# Patient Record
Sex: Female | Born: 1950 | Race: White | Hispanic: No | Marital: Married | Smoking: Never smoker
Health system: Southern US, Community
[De-identification: ages and names within clinical notes are randomized; demographics above are authoritative.]

## PROBLEM LIST (undated history)

## (undated) DIAGNOSIS — E0590 Thyrotoxicosis, unspecified without thyrotoxic crisis or storm: Secondary | ICD-10-CM

## (undated) DIAGNOSIS — M069 Rheumatoid arthritis, unspecified: Secondary | ICD-10-CM

## (undated) DIAGNOSIS — M1990 Unspecified osteoarthritis, unspecified site: Secondary | ICD-10-CM

## (undated) DIAGNOSIS — J45909 Unspecified asthma, uncomplicated: Secondary | ICD-10-CM

## (undated) DIAGNOSIS — I350 Nonrheumatic aortic (valve) stenosis: Secondary | ICD-10-CM

## (undated) DIAGNOSIS — E042 Nontoxic multinodular goiter: Secondary | ICD-10-CM

## (undated) DIAGNOSIS — I429 Cardiomyopathy, unspecified: Secondary | ICD-10-CM

## (undated) DIAGNOSIS — N83209 Unspecified ovarian cyst, unspecified side: Secondary | ICD-10-CM

## (undated) DIAGNOSIS — I499 Cardiac arrhythmia, unspecified: Secondary | ICD-10-CM

## (undated) DIAGNOSIS — I10 Essential (primary) hypertension: Secondary | ICD-10-CM

## (undated) HISTORY — PX: EXCISION MORTON'S NEUROMA: SHX5013

## (undated) HISTORY — DX: Unspecified ovarian cyst, unspecified side: N83.209

## (undated) HISTORY — DX: Rheumatoid arthritis, unspecified: M06.9

## (undated) HISTORY — PX: CARDIAC CATHETERIZATION: SHX172

## (undated) HISTORY — DX: Essential (primary) hypertension: I10

## (undated) HISTORY — PX: JOINT REPLACEMENT: SHX530

## (undated) HISTORY — DX: Nontoxic multinodular goiter: E04.2

---

## 2005-06-04 ENCOUNTER — Emergency Department: Payer: Self-pay | Admitting: Unknown Physician Specialty

## 2006-04-14 ENCOUNTER — Ambulatory Visit: Payer: Self-pay | Admitting: Family Medicine

## 2006-04-22 ENCOUNTER — Ambulatory Visit: Payer: Self-pay | Admitting: Family Medicine

## 2006-06-25 ENCOUNTER — Ambulatory Visit: Payer: Self-pay | Admitting: Podiatry

## 2007-05-19 ENCOUNTER — Ambulatory Visit: Payer: Self-pay | Admitting: Family Medicine

## 2007-06-28 DIAGNOSIS — N83209 Unspecified ovarian cyst, unspecified side: Secondary | ICD-10-CM

## 2007-06-28 HISTORY — PX: COMBINED HYSTEROSCOPY DIAGNOSTIC / D&C: SUR297

## 2007-06-28 HISTORY — PX: ESOPHAGOGASTRODUODENOSCOPY: SHX1529

## 2007-06-28 HISTORY — DX: Unspecified ovarian cyst, unspecified side: N83.209

## 2007-07-04 ENCOUNTER — Ambulatory Visit: Payer: Self-pay | Admitting: Unknown Physician Specialty

## 2007-07-05 ENCOUNTER — Ambulatory Visit: Payer: Self-pay | Admitting: Gastroenterology

## 2007-07-07 ENCOUNTER — Ambulatory Visit: Payer: Self-pay | Admitting: Unknown Physician Specialty

## 2007-08-16 ENCOUNTER — Ambulatory Visit: Payer: Self-pay | Admitting: Otolaryngology

## 2007-08-18 ENCOUNTER — Ambulatory Visit: Payer: Self-pay | Admitting: Otolaryngology

## 2007-11-23 ENCOUNTER — Ambulatory Visit: Payer: Self-pay | Admitting: Unknown Physician Specialty

## 2008-06-04 ENCOUNTER — Ambulatory Visit: Payer: Self-pay | Admitting: Internal Medicine

## 2008-08-27 ENCOUNTER — Ambulatory Visit: Payer: Self-pay | Admitting: Internal Medicine

## 2009-06-06 ENCOUNTER — Ambulatory Visit: Payer: Self-pay | Admitting: Internal Medicine

## 2010-06-09 ENCOUNTER — Ambulatory Visit: Payer: Self-pay | Admitting: Internal Medicine

## 2010-08-28 ENCOUNTER — Ambulatory Visit: Payer: Self-pay | Admitting: Internal Medicine

## 2011-03-18 ENCOUNTER — Ambulatory Visit: Payer: Self-pay | Admitting: Internal Medicine

## 2011-07-23 ENCOUNTER — Ambulatory Visit: Payer: Self-pay | Admitting: Internal Medicine

## 2011-09-07 ENCOUNTER — Ambulatory Visit: Payer: Self-pay | Admitting: Surgery

## 2011-09-09 LAB — PATHOLOGY REPORT

## 2011-09-17 ENCOUNTER — Telehealth: Payer: Self-pay | Admitting: Internal Medicine

## 2011-09-17 NOTE — Telephone Encounter (Signed)
Pt called to make sure you have labs that she had done July and sterotack[facility] biospy on her breast this was done [place] 09/07/11 pt has appointment with dr Darrick Huntsman on 10/1

## 2011-09-28 ENCOUNTER — Encounter: Payer: Self-pay | Admitting: Internal Medicine

## 2011-09-28 ENCOUNTER — Ambulatory Visit (INDEPENDENT_AMBULATORY_CARE_PROVIDER_SITE_OTHER): Payer: Self-pay | Admitting: Internal Medicine

## 2011-09-28 VITALS — BP 147/86 | HR 78 | Temp 97.9°F | Resp 16 | Ht 61.5 in | Wt 166.2 lb

## 2011-09-28 DIAGNOSIS — E663 Overweight: Secondary | ICD-10-CM

## 2011-09-28 DIAGNOSIS — M069 Rheumatoid arthritis, unspecified: Secondary | ICD-10-CM | POA: Insufficient documentation

## 2011-09-28 DIAGNOSIS — E669 Obesity, unspecified: Secondary | ICD-10-CM

## 2011-09-28 DIAGNOSIS — Z6825 Body mass index (BMI) 25.0-25.9, adult: Secondary | ICD-10-CM

## 2011-09-28 DIAGNOSIS — M810 Age-related osteoporosis without current pathological fracture: Secondary | ICD-10-CM

## 2011-09-28 MED ORDER — IBANDRONATE SODIUM 150 MG PO TABS: 150.0000 mg | ORAL_TABLET | ORAL | Status: DC

## 2011-09-28 MED ORDER — PHENTERMINE HCL 37.5 MG PO TABS: 18.0000 mg | ORAL_TABLET | Freq: Every day | ORAL | Status: DC

## 2011-09-28 NOTE — Progress Notes (Signed)
  Subjective:    Patient ID: Brooke Tucker, female    DOB: 1951-12-12, 60 y.o.   MRN: [id number]  HPI 60 yo white female with history of rheumatoid arthritis, osteoporosis, and obesity presents for 6 month followup.  She has intentionally 20-25 lbs over the last 6 months through diet and exercise but weight has plateaued in the last month and she is frustrated.  She has foot surgery planned for early December on her right 5th metatarsal and will be unable to exercise for 4-6 weeks and is concerned about the potential weight gain.  Her goal is to be 150 lbs.    Also she underwent a left breast stereotactic biopsy done bc of microcalcifications .  Occurred on sept 10th after waiting 1 month for appt after abnormal mammogram., .  The biopsy was normal .      Review of Systems  Constitutional: Negative for fever, chills and unexpected weight change.  HENT: Negative for hearing loss, ear pain, nosebleeds, congestion, sore throat, facial swelling, rhinorrhea, sneezing, mouth sores, trouble swallowing, neck pain, neck stiffness, voice change, postnasal drip, sinus pressure, tinnitus and ear discharge.   Eyes: Negative for pain, discharge, redness and visual disturbance.  Respiratory: Negative for cough, chest tightness, shortness of breath, wheezing and stridor.   Cardiovascular: Negative for chest pain, palpitations and leg swelling.  Musculoskeletal: Negative for myalgias and arthralgias.  Skin: Negative for color change and rash.  Neurological: Negative for dizziness, weakness, light-headedness and headaches.  Hematological: Negative for adenopathy.       Objective:   Physical Exam  Constitutional: She is oriented to person, place, and time. She appears well-developed and well-nourished.  HENT:  Mouth/Throat: Oropharynx is clear and moist.  Eyes: EOM are normal. Pupils are equal, round, and reactive to light. No scleral icterus.  Neck: Normal range of motion. Neck supple. No JVD present. No  thyromegaly present.  Cardiovascular: Normal rate, regular rhythm, normal heart sounds and intact distal pulses.   Pulmonary/Chest: Effort normal and breath sounds normal.  Abdominal: Soft. Bowel sounds are normal. She exhibits no mass. There is no tenderness.  Musculoskeletal: Normal range of motion. She exhibits no edema.  Lymphadenopathy:    She has no cervical adenopathy.  Neurological: She is alert and oriented to person, place, and time.  Skin: Skin is warm and dry.  Psychiatric: She has a normal mood and affect.          Assessment & Plan:

## 2011-09-28 NOTE — Patient Instructions (Signed)
Take 1000 units of Vit D daily.Brooke Tucker check your level in Feb/March.  We are going to try phentermine (Adipex)  1/2 tablet once or twice daily 1 hour before your largest meals to help you lose the weight .   Consider trying the Atkins snack size (140 cal or less) protein bar,  Compare it to the Fiber One bar for carbohydrates and fiber.   Try  Joseph's brand pita bread and flat bread (red wrapper)  50 cal and 4 carbs/serving.   Try Breyer's carb smart fudgsicle:  It has 100 cal and 3 net carbs.

## 2011-09-28 NOTE — Assessment & Plan Note (Signed)
With partial success but recent plateau despite daily efforts.  Trial of phentermine after 10 minute discussion of risks and benefits.

## 2011-10-26 ENCOUNTER — Other Ambulatory Visit (INDEPENDENT_AMBULATORY_CARE_PROVIDER_SITE_OTHER): Payer: Managed Care, Other (non HMO) | Admitting: *Deleted

## 2011-10-26 DIAGNOSIS — E785 Hyperlipidemia, unspecified: Secondary | ICD-10-CM

## 2011-10-26 LAB — LIPID PANEL
Cholesterol: 166 mg/dL (ref 0–200)
HDL: 72.2 mg/dL (ref >39.00)
Total CHOL/HDL Ratio: 2
Triglycerides: 83 mg/dL (ref 0.0–149.0)

## 2011-11-06 ENCOUNTER — Encounter: Payer: Self-pay | Admitting: Internal Medicine

## 2011-11-09 ENCOUNTER — Ambulatory Visit (INDEPENDENT_AMBULATORY_CARE_PROVIDER_SITE_OTHER): Payer: Managed Care, Other (non HMO) | Admitting: Internal Medicine

## 2011-11-09 ENCOUNTER — Encounter: Payer: Self-pay | Admitting: Internal Medicine

## 2011-11-09 VITALS — BP 124/70 | HR 72 | Temp 98.5°F | Resp 14 | Ht 62.0 in | Wt 157.5 lb

## 2011-11-09 DIAGNOSIS — Z01818 Encounter for other preprocedural examination: Secondary | ICD-10-CM

## 2011-11-09 DIAGNOSIS — Z6825 Body mass index (BMI) 25.0-25.9, adult: Secondary | ICD-10-CM

## 2011-11-09 DIAGNOSIS — E663 Overweight: Secondary | ICD-10-CM

## 2011-11-09 NOTE — Patient Instructions (Signed)
You are doing great on the weight loss program.  We will stop the phentermimne when you reach 150 lbs or if your weight plateaus for a month, whichever comes first.  Your cholesterol is excellent!  Return in 6 months.

## 2011-11-09 NOTE — Progress Notes (Signed)
Subjective:    Patient ID: Brooke Tucker, female    DOB: 08/28/1951, 60 y.o.   MRN: [id number]  HPI  [name] is a 60 yo white female with a history of RA and osteoporosis referred by Dr. Alberteen Tucker for a preoperative evaluation for planned right foot surgery by Dr. Alberteen Tucker on 12/7.  She feels great and has continued to lose weight intentionally using phentermine which is suppressing her appetite.  Her initial weight was 190 lbs in Feb 2012 . She denies recent  URI symptoms, chest pain or shortness of breath.  She has no history of diabetes, hypertension, CAD or renal insufficiency.  Past Medical History  Diagnosis Date  . Hypertension   . Rheumatoid arthritis   . Ovarian cyst July 2008    laparscopic biopsy normal    Current Outpatient Prescriptions on File Prior to Visit  Medication Sig Dispense Refill  . Calcium Carbonate-Vitamin D (CALCIUM + D) 600-200 MG-UNIT TABS Take by mouth.        . ergocalciferol (VITAMIN D2) 50000 UNITS capsule Take 50,000 Units by mouth once a week.        . ibandronate (BONIVA) 150 MG tablet Take 1 tablet (150 mg total) by mouth every 30 (thirty) days. Take in the morning with a full glass of water, on an empty stomach, and do not take anything else by mouth or lie down for the next 30 min.  3 tablet  3  . methotrexate (RHEUMATREX) 2.5 MG tablet Take 2.5 mg by mouth once a week. Take three tablets on Thursday.       . Multiple Vitamin (MULTIVITAMIN) tablet Take 1 tablet by mouth daily.          Review of Systems  Constitutional: Negative for fever, chills and unexpected weight change.  HENT: Negative for hearing loss, ear pain, nosebleeds, congestion, sore throat, facial swelling, rhinorrhea, sneezing, mouth sores, trouble swallowing, neck pain, neck stiffness, voice change, postnasal drip, sinus pressure, tinnitus and ear discharge.   Eyes: Negative for pain, discharge, redness and visual disturbance.  Respiratory: Negative for cough, chest tightness, shortness  of breath, wheezing and stridor.   Cardiovascular: Negative for chest pain, palpitations and leg swelling.  Musculoskeletal: Negative for myalgias and arthralgias.  Skin: Negative for color change and rash.  Neurological: Negative for dizziness, weakness, light-headedness and headaches.  Hematological: Negative for adenopathy.       Objective:   Physical Exam  Constitutional: She is oriented to person, place, and time. She appears well-developed and well-nourished.  HENT:  Mouth/Throat: Oropharynx is clear and moist.  Eyes: EOM are normal. Pupils are equal, round, and reactive to light. No scleral icterus.  Neck: Normal range of motion. Neck supple. No JVD present. No thyromegaly present.  Cardiovascular: Normal rate, regular rhythm, normal heart sounds and intact distal pulses.   Pulmonary/Chest: Effort normal and breath sounds normal.  Abdominal: Soft. Bowel sounds are normal. She exhibits no mass. There is no tenderness.  Musculoskeletal: Normal range of motion. She exhibits edema.  Lymphadenopathy:    She has no cervical adenopathy.  Neurological: She is alert and oriented to person, place, and time.  Skin: Skin is warm and dry.  Psychiatric: She has a normal mood and affect.          Assessment & Plan:  Preoperative evaluation; exam and screening EKG done in office today was normal.  She is considered low risk for perioperative morbidity/mortality.  Overweight:  She is now considered overweight ,  no longer obese,with loss of over 30 lbs  which she has shed with appetite suppression and exercise.  Continue phentermine until  she reaches 150 lbs or her weight plateaus.

## 2011-11-16 ENCOUNTER — Encounter: Payer: Self-pay | Admitting: Internal Medicine

## 2011-11-28 HISTORY — PX: METATARSAL OSTEOTOMY: SHX1641

## 2011-12-04 ENCOUNTER — Ambulatory Visit: Payer: Self-pay | Admitting: Podiatry

## 2011-12-10 LAB — PATHOLOGY REPORT

## 2011-12-25 ENCOUNTER — Encounter: Payer: Self-pay | Admitting: Internal Medicine

## 2012-02-03 ENCOUNTER — Encounter: Payer: Self-pay | Admitting: Internal Medicine

## 2012-03-07 ENCOUNTER — Ambulatory Visit: Payer: Self-pay | Admitting: Surgery

## 2012-03-22 LAB — HM MAMMOGRAPHY

## 2012-04-14 ENCOUNTER — Other Ambulatory Visit: Payer: Self-pay | Admitting: Internal Medicine

## 2012-04-15 ENCOUNTER — Telehealth: Payer: Self-pay | Admitting: Internal Medicine

## 2012-04-15 NOTE — Telephone Encounter (Signed)
Patient states the pharmacy sent over refill request yesterday for her phentermine 37.5 mg, she uses [facility].

## 2012-04-15 NOTE — Telephone Encounter (Signed)
This request was put in yesterday and is waiting MD approval.

## 2012-05-30 ENCOUNTER — Other Ambulatory Visit: Payer: Self-pay | Admitting: Internal Medicine

## 2012-05-30 MED ORDER — IBANDRONATE SODIUM 150 MG PO TABS: 150.0000 mg | ORAL_TABLET | ORAL | Status: DC

## 2012-05-30 NOTE — Telephone Encounter (Signed)
Patient wants the Rx faxed to her  Fax number: [phone]

## 2012-05-30 NOTE — Telephone Encounter (Signed)
printed

## 2012-10-13 ENCOUNTER — Other Ambulatory Visit: Payer: Self-pay

## 2012-10-13 DIAGNOSIS — Z0000 Encounter for general adult medical examination without abnormal findings: Secondary | ICD-10-CM

## 2012-10-14 ENCOUNTER — Other Ambulatory Visit (INDEPENDENT_AMBULATORY_CARE_PROVIDER_SITE_OTHER): Payer: BC Managed Care – PPO

## 2012-10-14 DIAGNOSIS — Z0000 Encounter for general adult medical examination without abnormal findings: Secondary | ICD-10-CM

## 2012-10-14 LAB — COMPREHENSIVE METABOLIC PANEL
Albumin: 3.6 g/dL (ref 3.5–5.2)
Alkaline Phosphatase: 59 U/L (ref 39–117)
CO2: 29 mEq/L (ref 19–32)
Calcium: 8.9 mg/dL (ref 8.4–10.5)
Chloride: 105 mEq/L (ref 96–112)
Glucose, Bld: 76 mg/dL (ref 70–99)
Potassium: 4.1 mEq/L (ref 3.5–5.1)
Sodium: 140 mEq/L (ref 135–145)
Total Protein: 6.7 g/dL (ref 6.0–8.3)

## 2012-10-14 LAB — LIPID PANEL
Cholesterol: 200 mg/dL (ref 0–200)
LDL Cholesterol: 112 mg/dL — ABNORMAL HIGH (ref 0–99)
Triglycerides: 58 mg/dL (ref 0.0–149.0)

## 2012-10-14 LAB — CBC WITH DIFFERENTIAL/PLATELET
Eosinophils Relative: 4.3 % (ref 0.0–5.0)
Lymphocytes Relative: 19 % (ref 12.0–46.0)
Monocytes Absolute: 0.5 10*3/uL (ref 0.1–1.0)
Monocytes Relative: 8.1 % (ref 3.0–12.0)
Neutrophils Relative %: 67.9 % (ref 43.0–77.0)
Platelets: 211 10*3/uL (ref 150.0–400.0)
RBC: 4.21 Mil/uL (ref 3.87–5.11)
WBC: 6.2 10*3/uL (ref 4.5–10.5)

## 2012-10-14 LAB — HEPATIC FUNCTION PANEL
ALT: 14 U/L (ref 0–35)
Total Bilirubin: 0.4 mg/dL (ref 0.3–1.2)

## 2012-10-17 ENCOUNTER — Other Ambulatory Visit: Payer: Managed Care, Other (non HMO)

## 2012-10-20 ENCOUNTER — Ambulatory Visit (INDEPENDENT_AMBULATORY_CARE_PROVIDER_SITE_OTHER): Payer: BC Managed Care – PPO | Admitting: Internal Medicine

## 2012-10-20 ENCOUNTER — Other Ambulatory Visit (HOSPITAL_COMMUNITY)
Admission: RE | Admit: 2012-10-20 | Discharge: 2012-10-20 | Disposition: A | Discharge status: Home / Self Care | Payer: BC Managed Care – PPO | Source: Ambulatory Visit | Attending: Internal Medicine | Admitting: Internal Medicine

## 2012-10-20 ENCOUNTER — Encounter: Payer: Self-pay | Admitting: Internal Medicine

## 2012-10-20 VITALS — BP 120/72 | HR 68 | Temp 98.7°F | Resp 12 | Ht 62.5 in | Wt 160.5 lb

## 2012-10-20 DIAGNOSIS — Z79899 Other long term (current) drug therapy: Secondary | ICD-10-CM

## 2012-10-20 DIAGNOSIS — L900 Lichen sclerosus et atrophicus: Secondary | ICD-10-CM

## 2012-10-20 DIAGNOSIS — Z1151 Encounter for screening for human papillomavirus (HPV): Secondary | ICD-10-CM | POA: Insufficient documentation

## 2012-10-20 DIAGNOSIS — L940 Localized scleroderma [morphea]: Secondary | ICD-10-CM

## 2012-10-20 DIAGNOSIS — Z0000 Encounter for general adult medical examination without abnormal findings: Secondary | ICD-10-CM

## 2012-10-20 DIAGNOSIS — M810 Age-related osteoporosis without current pathological fracture: Secondary | ICD-10-CM | POA: Insufficient documentation

## 2012-10-20 DIAGNOSIS — M069 Rheumatoid arthritis, unspecified: Secondary | ICD-10-CM

## 2012-10-20 DIAGNOSIS — Z01419 Encounter for gynecological examination (general) (routine) without abnormal findings: Secondary | ICD-10-CM | POA: Insufficient documentation

## 2012-10-20 DIAGNOSIS — Z124 Encounter for screening for malignant neoplasm of cervix: Secondary | ICD-10-CM

## 2012-10-20 DIAGNOSIS — Z1239 Encounter for other screening for malignant neoplasm of breast: Secondary | ICD-10-CM

## 2012-10-20 DIAGNOSIS — Z23 Encounter for immunization: Secondary | ICD-10-CM

## 2012-10-20 MED ORDER — PHENTERMINE HCL 37.5 MG PO TABS: 37.5000 mg | ORAL_TABLET | Freq: Every day | ORAL | Status: DC

## 2012-10-20 NOTE — Patient Instructions (Addendum)
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

## 2012-10-20 NOTE — Progress Notes (Signed)
Patient ID: Brooke Tucker, female   DOB: 1951/05/01, 61 y.o.   MRN: [id number]  Subjective:     Brooke Tucker is a 61 y.o. female and is here for a comprehensive physical exam. The patient reports problems - foot pain due to RA.  History   Social History  . Marital Status: Married    Spouse Name: N/A    Number of Children: N/A  . Years of Education: N/A   Occupational History  . Not on file.   Social History Main Topics  . Smoking status: Never Smoker   . Smokeless tobacco: Never Used  . Alcohol Use: Yes     occasional  . Drug Use: No  . Sexually Active: Not on file   Other Topics Concern  . Not on file   Social History Narrative  . No narrative on file   Health Maintenance  Topic Date Due  . Zostavax  08/30/2011  . Pap Smear  06/27/2012  . Influenza Vaccine  08/28/2012  . Mammogram  09/06/2013  . Colonoscopy  06/27/2017  . Tetanus/tdap  10/20/2022    The following portions of the patient's history were reviewed and updated as appropriate: allergies, current medications, past family history, past medical history, past social history, past surgical history and problem list.  Review of Systems A comprehensive review of systems was negative except for: Musculoskeletal: positive for arthralgias   Objective:        Assessment:    Healthy female exam. BP 120/72  Pulse 68  Temp 98.7 F (37.1 C) (Oral)  Resp 12  Ht 5' 2.5" (1.588 m)  Wt 160 lb 8 oz (72.802 kg)  BMI 28.89 kg/m2  SpO2 97%  General Appearance:    Alert, cooperative, no distress, appears stated age  Head:    Normocephalic, without obvious abnormality, atraumatic  Eyes:    PERRL, conjunctiva/corneas clear, EOM's intact, fundi    benign, both eyes  Ears:    Normal TM's and external ear canals, both ears  Nose:   Nares normal, septum midline, mucosa normal, no drainage    or sinus tenderness  Throat:   Lips, mucosa, and tongue normal; teeth and gums normal  Neck:   Supple, symmetrical, trachea  midline, no adenopathy;    thyroid:  no enlargement/tenderness/nodules; no carotid   bruit or JVD  Back:     Symmetric, no curvature, ROM normal, no CVA tenderness  Lungs:     Clear to auscultation bilaterally, respirations unlabored  Chest Wall:    No tenderness or deformity   Heart:    Regular rate and rhythm, S1 and S2 normal, no murmur, rub   or gallop  Breast Exam:    No tenderness, masses, or nipple abnormality  Abdomen:     Soft, non-tender, bowel sounds active all four quadrants,    no masses, no organomegaly  Genitalia:  Pale external genitalia  Effacement suggestive of lichen sclerosis. 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   Extremities:   Extremities normal, atraumatic, no cyanosis or edema  Pulses:   2+ and symmetric all extremities  Skin:   Skin color, texture, turgor normal, no rashes or lesions  Lymph nodes:   Cervical, supraclavicular, and axillary nodes normal  Neurologic:   CNII-XII intact, normal strength, sensation and reflexes    throughout       Plan:   Osteoporosis T. scores have been stable on Boniva. Last DEXA was September 2011 and her lowest T score was -  2.2  Rheumatoid arthritis With recent flares involving both feet precipitated by foot surgery in December. Symptoms are currently manageable with current dose of methotrexate and use of orthotics. Follow up with Dr. Lavenia Atlas as directed.  Encounter for long-term (current) use of other medications She receives regular follow up on liver and bone marrow function on methotrexate.  Routine general medical examination at a health care facility Breast pelvic and Pap smear were done today. 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 She has a history rheumatoid arthritis and has chronic itching of the vulva. We discussed starting therapy with steroid cream if her Pap smear was normal. If her Pap smear was abnormal we will also to she sees  gynecology.   Updated Medication List Outpatient Encounter Prescriptions as of 10/20/2012  Medication Sig Dispense Refill  . Calcium Carbonate-Vitamin D (CALCIUM + D) 600-200 MG-UNIT TABS Take by mouth.        . ibandronate (BONIVA) 150 MG tablet Take 1 tablet (150 mg total) by mouth every 30 (thirty) days. Take in the morning with a full glass of water, on an empty stomach, and do not take anything else by mouth or lie down for the next 30 min.  3 tablet  3  . methotrexate (RHEUMATREX) 2.5 MG tablet Take 2.5 mg by mouth once a week. Take six tablets on Thursday.      . Multiple Vitamin (MULTIVITAMIN) tablet Take 1 tablet by mouth daily.        . ergocalciferol (VITAMIN D2) 50000 UNITS capsule Take 50,000 Units by mouth once a week.        . phentermine (ADIPEX-P) 37.5 MG tablet Take 1 tablet (37.5 mg total) by mouth daily before breakfast.  30 tablet  0  . DISCONTD: phentermine (ADIPEX-P) 37.5 MG tablet take 1/2 tablet by mouth once daily BEFORE BREAKFAST.  30 tablet  0  . DISCONTD: phentermine 37.5 MG capsule Take 37.5 mg by mouth every morning. Take one-half tablet before breakfast

## 2012-10-21 ENCOUNTER — Encounter: Payer: Self-pay | Admitting: Internal Medicine

## 2012-10-22 ENCOUNTER — Encounter: Payer: Self-pay | Admitting: Internal Medicine

## 2012-10-22 DIAGNOSIS — L900 Lichen sclerosus et atrophicus: Secondary | ICD-10-CM | POA: Insufficient documentation

## 2012-10-22 DIAGNOSIS — Z0000 Encounter for general adult medical examination without abnormal findings: Secondary | ICD-10-CM | POA: Insufficient documentation

## 2012-10-22 DIAGNOSIS — Z79899 Other long term (current) drug therapy: Secondary | ICD-10-CM | POA: Insufficient documentation

## 2012-10-22 NOTE — Assessment & Plan Note (Signed)
With recent flares involving both feet precipitated by foot surgery in December. Symptoms are currently manageable with current dose of methotrexate and use of orthotics. Follow up with Dr. Lavenia Atlas as directed.

## 2012-10-22 NOTE — Assessment & Plan Note (Signed)
Breast pelvic and Pap smear were done today. Mammogram is not due until March 2014

## 2012-10-22 NOTE — Assessment & Plan Note (Signed)
She receives regular follow up on liver and bone marrow function on methotrexate.

## 2012-10-22 NOTE — Assessment & Plan Note (Signed)
Suggested by exam. She has a history rheumatoid arthritis and has chronic itching of the vulva. We discussed starting therapy with steroid cream if her Pap smear was normal. If her Pap smear was abnormal we will also to she sees gynecology.

## 2012-10-22 NOTE — Assessment & Plan Note (Signed)
T. scores have been stable on Boniva. Last DEXA was September 2011 and her lowest T score was -2.2

## 2012-10-25 ENCOUNTER — Other Ambulatory Visit: Payer: Self-pay

## 2012-10-25 ENCOUNTER — Encounter: Payer: Self-pay | Admitting: Internal Medicine

## 2012-10-25 DIAGNOSIS — L900 Lichen sclerosus et atrophicus: Secondary | ICD-10-CM

## 2012-10-25 MED ORDER — FLUTICASONE PROPIONATE 50 MCG/ACT NA SUSP: 2.0000 | Freq: Every day | NASAL | Status: DC

## 2012-10-25 MED ORDER — IBANDRONATE SODIUM 150 MG PO TABS: 150.0000 mg | ORAL_TABLET | ORAL | Status: DC

## 2012-10-25 NOTE — Telephone Encounter (Signed)
Rx on [facility]. Please mail to patient.

## 2012-10-26 MED ORDER — CLOBETASOL PROPIONATE 0.05 % EX OINT: TOPICAL_OINTMENT | CUTANEOUS | Status: DC

## 2012-12-09 ENCOUNTER — Other Ambulatory Visit: Payer: Self-pay | Admitting: Internal Medicine

## 2012-12-12 ENCOUNTER — Other Ambulatory Visit: Payer: Self-pay

## 2012-12-12 MED ORDER — PHENTERMINE HCL 37.5 MG PO TABS: 37.5000 mg | ORAL_TABLET | Freq: Every day | ORAL | Status: DC

## 2012-12-12 NOTE — Telephone Encounter (Signed)
Patient request refill for Phentermine 37.5 mg. Ok to refill?

## 2012-12-13 ENCOUNTER — Other Ambulatory Visit: Payer: Self-pay

## 2012-12-13 NOTE — Telephone Encounter (Signed)
rx faxed to [facility] [phone]

## 2012-12-14 MED ORDER — PHENTERMINE HCL 37.5 MG PO TABS: 37.5000 mg | ORAL_TABLET | Freq: Every day | ORAL | Status: DC

## 2012-12-14 NOTE — Telephone Encounter (Signed)
Med filled.  

## 2013-01-09 ENCOUNTER — Encounter: Payer: Self-pay | Admitting: Internal Medicine

## 2013-01-09 NOTE — Telephone Encounter (Signed)
Can you schedule this appt?

## 2013-02-11 ENCOUNTER — Other Ambulatory Visit: Payer: Self-pay

## 2013-02-17 ENCOUNTER — Encounter: Payer: Self-pay | Admitting: Internal Medicine

## 2013-02-17 DIAGNOSIS — E559 Vitamin D deficiency, unspecified: Secondary | ICD-10-CM

## 2013-02-17 MED ORDER — ERGOCALCIFEROL 1.25 MG (50000 UT) PO CAPS: 50000.0000 [IU] | ORAL_CAPSULE | ORAL | Status: DC

## 2013-02-19 ENCOUNTER — Telehealth: Payer: Self-pay | Admitting: Internal Medicine

## 2013-02-19 DIAGNOSIS — E559 Vitamin D deficiency, unspecified: Secondary | ICD-10-CM

## 2013-02-19 MED ORDER — ERGOCALCIFEROL 1.25 MG (50000 UT) PO CAPS: 50000.0000 [IU] | ORAL_CAPSULE | ORAL | Status: DC

## 2013-02-19 NOTE — Telephone Encounter (Signed)
Her vitamin D is low, which can increase her risk ofr weak bones and fractures.   i am calling in a megadose of Vit D to taek once a week for 3 months,  Then after she finishes that , she should start taking an OTC  Vit D3 supplement 1000 units daily.

## 2013-02-21 NOTE — Telephone Encounter (Signed)
Pt.notified

## 2013-04-10 ENCOUNTER — Encounter: Payer: Self-pay | Admitting: Internal Medicine

## 2013-04-17 ENCOUNTER — Ambulatory Visit: Payer: BC Managed Care – PPO | Admitting: Internal Medicine

## 2013-05-17 ENCOUNTER — Ambulatory Visit: Payer: BC Managed Care – PPO | Admitting: Internal Medicine

## 2013-07-07 ENCOUNTER — Telehealth: Payer: Self-pay | Admitting: Internal Medicine

## 2013-07-07 DIAGNOSIS — Z79899 Other long term (current) drug therapy: Secondary | ICD-10-CM

## 2013-07-07 DIAGNOSIS — E785 Hyperlipidemia, unspecified: Secondary | ICD-10-CM

## 2013-07-07 DIAGNOSIS — E559 Vitamin D deficiency, unspecified: Secondary | ICD-10-CM

## 2013-07-07 NOTE — Telephone Encounter (Signed)
Lab appointment made just need labs ordered. 8/23 lab and 8/27 CPE with you.

## 2013-07-07 NOTE — Telephone Encounter (Signed)
Patient wanting labs prior to her physical

## 2013-10-18 ENCOUNTER — Telehealth: Payer: Self-pay | Admitting: *Deleted

## 2013-10-18 DIAGNOSIS — R5381 Other malaise: Secondary | ICD-10-CM

## 2013-10-18 DIAGNOSIS — Z79899 Other long term (current) drug therapy: Secondary | ICD-10-CM

## 2013-10-18 DIAGNOSIS — E785 Hyperlipidemia, unspecified: Secondary | ICD-10-CM

## 2013-10-18 DIAGNOSIS — E559 Vitamin D deficiency, unspecified: Secondary | ICD-10-CM

## 2013-10-18 NOTE — Telephone Encounter (Signed)
Pt is coming in tomorrow what labs and dx?  

## 2013-10-19 ENCOUNTER — Other Ambulatory Visit (INDEPENDENT_AMBULATORY_CARE_PROVIDER_SITE_OTHER): Payer: BC Managed Care – PPO

## 2013-10-19 DIAGNOSIS — E559 Vitamin D deficiency, unspecified: Secondary | ICD-10-CM

## 2013-10-19 DIAGNOSIS — Z79899 Other long term (current) drug therapy: Secondary | ICD-10-CM

## 2013-10-19 DIAGNOSIS — R5381 Other malaise: Secondary | ICD-10-CM

## 2013-10-19 DIAGNOSIS — E785 Hyperlipidemia, unspecified: Secondary | ICD-10-CM

## 2013-10-19 LAB — LIPID PANEL
Cholesterol: 216 mg/dL — ABNORMAL HIGH (ref 0–200)
HDL: 105.6 mg/dL (ref >39.00)
Total CHOL/HDL Ratio: 2
Triglycerides: 77 mg/dL (ref 0.0–149.0)
VLDL: 15.4 mg/dL (ref 0.0–40.0)

## 2013-10-19 LAB — COMPREHENSIVE METABOLIC PANEL
AST: 24 U/L (ref 0–37)
Albumin: 4 g/dL (ref 3.5–5.2)
Alkaline Phosphatase: 72 U/L (ref 39–117)
BUN: 11 mg/dL (ref 6–23)
Calcium: 9.2 mg/dL (ref 8.4–10.5)
Chloride: 103 mEq/L (ref 96–112)
Glucose, Bld: 89 mg/dL (ref 70–99)
Potassium: 5 mEq/L (ref 3.5–5.1)
Sodium: 140 mEq/L (ref 135–145)
Total Bilirubin: 0.5 mg/dL (ref 0.3–1.2)
Total Protein: 7.3 g/dL (ref 6.0–8.3)

## 2013-10-19 LAB — CBC WITH DIFFERENTIAL/PLATELET
Basophils Relative: 0.8 % (ref 0.0–3.0)
Eosinophils Absolute: 0.2 10*3/uL (ref 0.0–0.7)
Eosinophils Relative: 3.5 % (ref 0.0–5.0)
Hemoglobin: 13.4 g/dL (ref 12.0–15.0)
Lymphocytes Relative: 19.2 % (ref 12.0–46.0)
Lymphs Abs: 1.1 10*3/uL (ref 0.7–4.0)
MCHC: 33.4 g/dL (ref 30.0–36.0)
Monocytes Absolute: 0.5 10*3/uL (ref 0.1–1.0)
Monocytes Relative: 9.5 % (ref 3.0–12.0)
Neutro Abs: 3.7 10*3/uL (ref 1.4–7.7)
Neutrophils Relative %: 67 % (ref 43.0–77.0)
RBC: 4.24 Mil/uL (ref 3.87–5.11)
RDW: 14.4 % (ref 11.5–14.6)
WBC: 5.6 10*3/uL (ref 4.5–10.5)

## 2013-10-19 LAB — TSH: TSH: 1.1 u[IU]/mL (ref 0.35–5.50)

## 2013-10-20 LAB — VITAMIN D 25 HYDROXY (VIT D DEFICIENCY, FRACTURES): Vit D, 25-Hydroxy: 31 ng/mL (ref 30–89)

## 2013-10-22 ENCOUNTER — Encounter: Payer: Self-pay | Admitting: Internal Medicine

## 2013-10-23 ENCOUNTER — Encounter: Payer: Self-pay | Admitting: Internal Medicine

## 2013-10-23 ENCOUNTER — Ambulatory Visit (INDEPENDENT_AMBULATORY_CARE_PROVIDER_SITE_OTHER): Payer: BC Managed Care – PPO | Admitting: Internal Medicine

## 2013-10-23 VITALS — BP 118/76 | HR 78 | Temp 98.1°F | Resp 12 | Ht 62.0 in | Wt 161.5 lb

## 2013-10-23 DIAGNOSIS — Z0000 Encounter for general adult medical examination without abnormal findings: Secondary | ICD-10-CM

## 2013-10-23 DIAGNOSIS — M810 Age-related osteoporosis without current pathological fracture: Secondary | ICD-10-CM

## 2013-10-23 DIAGNOSIS — E669 Obesity, unspecified: Secondary | ICD-10-CM

## 2013-10-23 DIAGNOSIS — Z1239 Encounter for other screening for malignant neoplasm of breast: Secondary | ICD-10-CM

## 2013-10-23 MED ORDER — ALPRAZOLAM 0.5 MG PO TABS
0.5000 mg | ORAL_TABLET | Freq: Every evening | ORAL | Status: DC | PRN
Start: 2013-10-23 — End: 2015-09-27

## 2013-10-23 MED ORDER — PHENTERMINE HCL 37.5 MG PO TABS: 18.0000 mg | ORAL_TABLET | Freq: Two times a day (BID) | ORAL | Status: DC

## 2013-10-23 NOTE — Assessment & Plan Note (Signed)
Refill on  Phentermine requested by patient. We discussed the risks and benefits of this medication specifically dependence. She is only using it during the holiday months to help avoid overeating. She is exercising regularly as her rheumatoid arthritis will permit. Discussed refilling until first of February.

## 2013-10-23 NOTE — Assessment & Plan Note (Signed)
Her last DEXA was in 2011 and her T. scores were stable. However she has discontinued use of Boniva. Repeat DEXA ordered.

## 2013-10-23 NOTE — Progress Notes (Signed)
Patient ID: Brooke Tucker, female   DOB: 07/18/1951, 62 y.o.   MRN: [id number]   Subjective:     Brooke Tucker is a 62 y.o. female and is here for a comprehensive physical exam. The patient reports no problems.  History   Social History  . Marital Status: Married    Spouse Name: N/A    Number of Children: N/A  . Years of Education: N/A   Occupational History  . Not on file.   Social History Main Topics  . Smoking status: Never Smoker   . Smokeless tobacco: Never Used  . Alcohol Use: Yes     Comment: occasional  . Drug Use: No  . Sexual Activity: Not Currently   Other Topics Concern  . Not on file   Social History Narrative  . No narrative on file   Health Maintenance  Topic Date Due  . Zostavax  08/30/2011  . Influenza Vaccine  07/28/2013  . Mammogram  03/22/2014  . Pap Smear  10/21/2015  . Colonoscopy  06/27/2017  . Tetanus/tdap  10/20/2022    The following portions of the patient's history were reviewed and updated as appropriate: allergies, current medications, past family history, past medical history, past social history, past surgical history and problem list.  Review of Systems A comprehensive review of systems was negative.   Objective:   BP 118/76  Pulse 78  Temp(Src) 98.1 F (36.7 C) (Oral)  Resp 12  Ht 5\' 2"  (1.575 m)  Wt 161 lb 8 oz (73.256 kg)  BMI 29.53 kg/m2  SpO2 98%  General Appearance:    Alert, cooperative, no distress, appears stated age  Head:    Normocephalic, without obvious abnormality, atraumatic  Eyes:    PERRL, conjunctiva/corneas clear, EOM's intact, fundi    benign, both eyes  Ears:    Normal TM's and external ear canals, both ears  Nose:   Nares normal, septum midline, mucosa normal, no drainage    or sinus tenderness  Throat:   Lips, mucosa, and tongue normal; teeth and gums normal  Neck:   Supple, symmetrical, trachea midline, no adenopathy;    thyroid:  no enlargement/tenderness/nodules; no carotid   bruit or JVD   Back:     Symmetric, no curvature, ROM normal, no CVA tenderness  Lungs:     Clear to auscultation bilaterally, respirations unlabored  Chest Wall:    No tenderness or deformity   Heart:    Regular rate and rhythm, S1 and S2 normal, no murmur, rub   or gallop  Breast Exam:    No tenderness, masses, or nipple abnormality  Abdomen:     Soft, non-tender, bowel sounds active all four quadrants,    no masses, no organomegaly  Genitalia:    deferred  Extremities:   Extremities normal, atraumatic, no cyanosis or edema  Pulses:   2+ and symmetric all extremities  Skin:   Skin color, texture, turgor normal, no rashes or lesions  Lymph nodes:   Cervical, supraclavicular, and axillary nodes normal  Neurologic:   CNII-XII intact, normal strength, sensation and reflexes    throughout     Assessment:   Routine general medical examination at a health care facility Annual comprehensive exam was done including breast exam, All screenings have been addressed .   Obesity (BMI 30-39.9) Refill on  Phentermine requested by patient. We discussed the risks and benefits of this medication specifically dependence. She is only using it during the holiday months to help avoid  overeating. She is exercising regularly as her rheumatoid arthritis will permit. Discussed refilling until first of February.  Osteoporosis Her last DEXA was in 2011 and her T. scores were stable. However she has discontinued use of Boniva. Repeat DEXA ordered.    Updated Medication List Outpatient Encounter Prescriptions as of 10/23/2013  Medication Sig Dispense Refill  . Calcium Carbonate-Vitamin D (CALCIUM + D) 600-200 MG-UNIT TABS Take by mouth.        . fluticasone (FLONASE) 50 MCG/ACT nasal spray Place 2 sprays into the nose daily.  48 g  3  . methotrexate (50 MG/ML) 1 G injection Inject 1 g into the vein once a week.      . phentermine (ADIPEX-P) 37.5 MG tablet Take 0.5 tablets (18.75 mg total) by mouth 2 (two) times daily.  30  tablet  2  . [DISCONTINUED] phentermine (ADIPEX-P) 37.5 MG tablet Take 1 tablet (37.5 mg total) by mouth daily before breakfast.  30 tablet  0  . ALPRAZolam (XANAX) 0.5 MG tablet Take 1 tablet (0.5 mg total) by mouth at bedtime as needed for anxiety.  20 tablet  0  . Multiple Vitamin (MULTIVITAMIN) tablet Take 1 tablet by mouth daily.        . [DISCONTINUED] clobetasol ointment (TEMOVATE) 0.05 % Apply topically at bedtime for 6 weeks, then as needed for itching  30 g  3  . [DISCONTINUED] ergocalciferol (DRISDOL) 50000 UNITS capsule Take 1 capsule (50,000 Units total) by mouth once a week.  4 capsule  0  . [DISCONTINUED] ergocalciferol (VITAMIN D2) 50000 UNITS capsule Take 1 capsule (50,000 Units total) by mouth once a week.  12 capsule  0  . [DISCONTINUED] ibandronate (BONIVA) 150 MG tablet Take 1 tablet (150 mg total) by mouth every 30 (thirty) days. Take in the morning with a full glass of water, on an empty stomach, and do not take anything else by mouth or lie down for the next 30 min.  3 tablet  3  . [DISCONTINUED] methotrexate (RHEUMATREX) 2.5 MG tablet Take 2.5 mg by mouth once a week. Take six tablets on Thursday.      . [DISCONTINUED] phentermine (ADIPEX-P) 37.5 MG tablet Take 1 tablet (37.5 mg total) by mouth daily before breakfast.  30 tablet  0   No facility-administered encounter medications on file as of 10/23/2013.

## 2013-10-23 NOTE — Assessment & Plan Note (Signed)
Annual comprehensive exam was done including breast exam, All screenings have been addressed .

## 2014-03-15 ENCOUNTER — Ambulatory Visit: Payer: Self-pay | Admitting: Internal Medicine

## 2014-03-19 ENCOUNTER — Telehealth: Payer: Self-pay | Admitting: Internal Medicine

## 2014-03-19 DIAGNOSIS — R920 Mammographic microcalcification found on diagnostic imaging of breast: Secondary | ICD-10-CM

## 2014-03-19 DIAGNOSIS — R921 Mammographic calcification found on diagnostic imaging of breast: Secondary | ICD-10-CM | POA: Insufficient documentation

## 2014-03-19 NOTE — Telephone Encounter (Signed)
Left message for patient to return call to office. 

## 2014-03-19 NOTE — Telephone Encounter (Signed)
[facility] has reported that Her mammogram was abnormal on the left because of calcifications .   She will be contacted to get additional films and an ultrasound the facility.

## 2014-03-19 NOTE — Telephone Encounter (Signed)
Patient stated scheduled for Friday.

## 2014-03-23 ENCOUNTER — Ambulatory Visit: Payer: Self-pay | Admitting: Internal Medicine

## 2014-04-09 ENCOUNTER — Encounter: Payer: Self-pay | Admitting: Internal Medicine

## 2014-04-16 ENCOUNTER — Encounter: Payer: Self-pay | Admitting: Emergency Medicine

## 2014-04-16 ENCOUNTER — Encounter: Payer: Self-pay | Admitting: Internal Medicine

## 2014-04-16 NOTE — Telephone Encounter (Signed)
DEXA's do not show up on schedule orders any longer. Please advise when one has been scheduled.

## 2014-04-16 NOTE — Telephone Encounter (Signed)
I see the order was place 10/23/13 for DEXA but not scheduled

## 2014-05-03 ENCOUNTER — Ambulatory Visit: Payer: Self-pay | Admitting: Internal Medicine

## 2014-05-03 LAB — HM DEXA SCAN

## 2014-05-04 ENCOUNTER — Telehealth: Payer: Self-pay | Admitting: Internal Medicine

## 2014-05-04 DIAGNOSIS — M810 Age-related osteoporosis without current pathological fracture: Secondary | ICD-10-CM

## 2014-05-04 NOTE — Assessment & Plan Note (Signed)
T scores about the same -2.4 is the worst,  After  Suspending  boniva  Continue calcium, bit D and repeat in 2 years

## 2015-03-04 ENCOUNTER — Encounter: Payer: Self-pay | Admitting: Internal Medicine

## 2015-03-04 DIAGNOSIS — E785 Hyperlipidemia, unspecified: Secondary | ICD-10-CM

## 2015-03-04 DIAGNOSIS — E559 Vitamin D deficiency, unspecified: Secondary | ICD-10-CM

## 2015-03-04 DIAGNOSIS — R5383 Other fatigue: Secondary | ICD-10-CM

## 2015-03-04 DIAGNOSIS — Z1159 Encounter for screening for other viral diseases: Secondary | ICD-10-CM

## 2015-03-04 NOTE — Telephone Encounter (Signed)
Vit d has been ordered

## 2015-03-15 ENCOUNTER — Other Ambulatory Visit (INDEPENDENT_AMBULATORY_CARE_PROVIDER_SITE_OTHER): Payer: BLUE CROSS/BLUE SHIELD

## 2015-03-15 DIAGNOSIS — R5383 Other fatigue: Secondary | ICD-10-CM

## 2015-03-15 DIAGNOSIS — E785 Hyperlipidemia, unspecified: Secondary | ICD-10-CM

## 2015-03-15 DIAGNOSIS — Z1159 Encounter for screening for other viral diseases: Secondary | ICD-10-CM

## 2015-03-15 DIAGNOSIS — E559 Vitamin D deficiency, unspecified: Secondary | ICD-10-CM

## 2015-03-15 LAB — CBC WITH DIFFERENTIAL/PLATELET
BASOS ABS: 0 10*3/uL (ref 0.0–0.1)
BASOS PCT: 0.5 % (ref 0.0–3.0)
Eosinophils Absolute: 0.3 10*3/uL (ref 0.0–0.7)
Eosinophils Relative: 5.7 % — ABNORMAL HIGH (ref 0.0–5.0)
HCT: 40.1 % (ref 36.0–46.0)
HEMOGLOBIN: 13.3 g/dL (ref 12.0–15.0)
LYMPHS PCT: 19.1 % (ref 12.0–46.0)
Lymphs Abs: 1.1 10*3/uL (ref 0.7–4.0)
MCHC: 33.2 g/dL (ref 30.0–36.0)
MCV: 92.5 fl (ref 78.0–100.0)
Monocytes Absolute: 0.6 10*3/uL (ref 0.1–1.0)
Monocytes Relative: 9.6 % (ref 3.0–12.0)
Neutro Abs: 3.9 10*3/uL (ref 1.4–7.7)
Neutrophils Relative %: 65.1 % (ref 43.0–77.0)
PLATELETS: 211 10*3/uL (ref 150.0–400.0)
RBC: 4.34 Mil/uL (ref 3.87–5.11)
RDW: 15.3 % (ref 11.5–15.5)
WBC: 6 10*3/uL (ref 4.0–10.5)

## 2015-03-15 LAB — COMPREHENSIVE METABOLIC PANEL
ALT: 12 U/L (ref 0–35)
AST: 16 U/L (ref 0–37)
Albumin: 3.9 g/dL (ref 3.5–5.2)
Alkaline Phosphatase: 80 U/L (ref 39–117)
BUN: 9 mg/dL (ref 6–23)
CO2: 30 meq/L (ref 19–32)
CREATININE: 0.71 mg/dL (ref 0.40–1.20)
Calcium: 9 mg/dL (ref 8.4–10.5)
Chloride: 103 mEq/L (ref 96–112)
GFR: 88.22 mL/min (ref >60.00)
GLUCOSE: 91 mg/dL (ref 70–99)
Potassium: 4.3 mEq/L (ref 3.5–5.1)
Sodium: 138 mEq/L (ref 135–145)
TOTAL PROTEIN: 6.9 g/dL (ref 6.0–8.3)
Total Bilirubin: 0.5 mg/dL (ref 0.2–1.2)

## 2015-03-15 LAB — LIPID PANEL
CHOL/HDL RATIO: 2
Cholesterol: 195 mg/dL (ref 0–200)
HDL: 82.7 mg/dL (ref >39.00)
LDL Cholesterol: 94 mg/dL (ref 0–99)
NONHDL: 112.3
Triglycerides: 93 mg/dL (ref 0.0–149.0)
VLDL: 18.6 mg/dL (ref 0.0–40.0)

## 2015-03-15 LAB — TSH: TSH: 1.1 u[IU]/mL (ref 0.35–4.50)

## 2015-03-15 LAB — VITAMIN D 25 HYDROXY (VIT D DEFICIENCY, FRACTURES): VITD: 17.34 ng/mL — ABNORMAL LOW (ref 30.00–100.00)

## 2015-03-16 ENCOUNTER — Encounter: Payer: Self-pay | Admitting: Internal Medicine

## 2015-03-16 DIAGNOSIS — E559 Vitamin D deficiency, unspecified: Secondary | ICD-10-CM | POA: Insufficient documentation

## 2015-03-16 LAB — HEPATITIS C ANTIBODY: HCV Ab: NEGATIVE

## 2015-03-16 MED ORDER — ERGOCALCIFEROL 1.25 MG (50000 UT) PO CAPS: 50000.0000 [IU] | ORAL_CAPSULE | ORAL | Status: DC

## 2015-03-16 NOTE — Addendum Note (Signed)
Addended by: Sherlene Shams on: 03/16/2015 04:56 PM   Modules accepted: Orders

## 2015-03-19 ENCOUNTER — Ambulatory Visit (INDEPENDENT_AMBULATORY_CARE_PROVIDER_SITE_OTHER): Payer: BLUE CROSS/BLUE SHIELD | Admitting: Internal Medicine

## 2015-03-19 ENCOUNTER — Encounter: Payer: Self-pay | Admitting: Internal Medicine

## 2015-03-19 VITALS — BP 110/78 | HR 77 | Temp 97.7°F | Resp 16 | Ht 61.25 in | Wt 174.8 lb

## 2015-03-19 DIAGNOSIS — Z0001 Encounter for general adult medical examination with abnormal findings: Secondary | ICD-10-CM | POA: Diagnosis not present

## 2015-03-19 DIAGNOSIS — Z23 Encounter for immunization: Secondary | ICD-10-CM

## 2015-03-19 DIAGNOSIS — R920 Mammographic microcalcification found on diagnostic imaging of breast: Secondary | ICD-10-CM | POA: Diagnosis not present

## 2015-03-19 DIAGNOSIS — Z0000 Encounter for general adult medical examination without abnormal findings: Secondary | ICD-10-CM

## 2015-03-19 DIAGNOSIS — E669 Obesity, unspecified: Secondary | ICD-10-CM

## 2015-03-19 DIAGNOSIS — R921 Mammographic calcification found on diagnostic imaging of breast: Secondary | ICD-10-CM | POA: Diagnosis not present

## 2015-03-19 MED ORDER — PHENTERMINE HCL 37.5 MG PO TABS: 18.0000 mg | ORAL_TABLET | Freq: Two times a day (BID) | ORAL | Status: DC

## 2015-03-19 MED ORDER — FLUTICASONE PROPIONATE 50 MCG/ACT NA SUSP: 2.0000 | Freq: Every day | NASAL | Status: DC

## 2015-03-19 NOTE — Progress Notes (Signed)
Patient ID: Brooke Tucker, female   DOB: 1951/01/02, 64 y.o.   MRN: [id number]   Subjective:     Brooke Tucker is a 64 y.o. female and is here for a comprehensive physical exam. The patient reports weight gain.  History   Social History  . Marital Status: Married    Spouse Name: N/A  . Number of Children: N/A  . Years of Education: N/A   Occupational History  . Not on file.   Social History Main Topics  . Smoking status: Never Smoker   . Smokeless tobacco: Never Used  . Alcohol Use: Yes     Comment: occasional  . Drug Use: No  . Sexual Activity: Not Currently   Other Topics Concern  . Not on file   Social History Narrative   Health Maintenance  Topic Date Due  . HIV Screening  08/29/1966  . ZOSTAVAX  08/30/2011  . INFLUENZA VACCINE  07/29/2015  . PAP SMEAR  10/24/2015  . MAMMOGRAM  03/23/2016  . COLONOSCOPY  06/27/2017  . TETANUS/TDAP  10/20/2022    The following portions of the patient's history were reviewed and updated as appropriate: allergies, current medications, past family history, past medical history, past social history, past surgical history and problem list.  Review of Systems Patient denies headache, fevers, malaise, unintentional weight loss, skin rash, eye pain, sinus congestion and sinus pain, sore throat, dysphagia,  hemoptysis , cough, dyspnea, wheezing, chest pain, palpitations, orthopnea, edema, abdominal pain, nausea, melena, diarrhea, constipation, flank pain, dysuria, hematuria, urinary  Frequency, nocturia, numbness, tingling, seizures,  Focal weakness, Loss of consciousness,  Tremor, insomnia, depression, anxiety, and suicidal ideation.      Objective:    BP 110/78 mmHg  Pulse 77  Temp(Src) 97.7 F (36.5 C) (Oral)  Resp 16  Ht 5' 1.25" (1.556 m)  Wt 174 lb 12 oz (79.266 kg)  BMI 32.74 kg/m2  SpO2 98%  General appearance: alert, cooperative and appears stated age Head: Normocephalic, without obvious abnormality,  atraumatic Eyes: conjunctivae/corneas clear. PERRL, EOM's intact. Fundi benign. Ears: normal TM's and external ear canals both ears Nose: Nares normal. Septum midline. Mucosa normal. No drainage or sinus tenderness. Throat: lips, mucosa, and tongue normal; teeth and gums normal Neck: no adenopathy, no carotid bruit, no JVD, supple, symmetrical, trachea midline and thyroid not enlarged, symmetric, no tenderness/mass/nodules Lungs: clear to auscultation bilaterally Breasts: normal appearance, no masses or tenderness Heart: regular rate and rhythm, S1, S2 normal, no murmur, click, rub or gallop Abdomen: soft, non-tender; bowel sounds normal; no masses,  no organomegaly Extremities: extremities normal, atraumatic, no cyanosis or edema Pulses: 2+ and symmetric Skin: Skin color, texture, turgor normal. No rashes or lesions Neurologic: Alert and oriented X 3, normal strength and tone. Normal symmetric reflexes. Normal coordination and gait.   Assessment and Plan:    Problem List Items Addressed This Visit      Unprioritized   Routine general medical examination at a health care facility    Annual wellness  exam was done as well as a comprehensive physical exam and management of acute and chronic conditions .  During the course of the visit the patient was educated and counseled about appropriate screening and preventive services including :  diabetes screening, lipid analysis with projected  10 year  risk for CAD , nutrition counseling, colorectal cancer screening, and recommended immunizations.  Printed recommendations for health maintenance screenings was given.        Obesity (BMI 30-39.9)  I have addressed  BMI and recommended wt loss of 10% of body weigh over the next 6 months using a low glycemic index diet and regular exercise a minimum of 5 days per week.  I have authorized the use of phentermine for 3 months  and a  return to see me in 3 months. Goal weight loss is 5% of today's  weight by then or phentermine will be discontinued.         Relevant Medications   phentermine (ADIPEX-P) 37.5 MG tablet   Abnormal mammogram with microcalcification    She is six months overdue for follow up .  Diagnostic bilateral mammogram ordered.        Other Visit Diagnoses    Breast calcification, left    -  Primary    Relevant Orders    MM Digital Diagnostic Bilat    Need for prophylactic vaccination against Streptococcus pneumoniae (pneumococcus)        Relevant Orders    Pneumococcal conjugate vaccine 13-valent (Completed)

## 2015-03-19 NOTE — Patient Instructions (Signed)
I have authorized the use of phentermine for 3 months.  Please  return to see me in 3 months  Take 1/2 tablet in the morning,  The second half by 2 PM to avoid insomnia.  Or take the whole tablet in the morning,  Goal weight loss is  5% of your starting weight by the end of the  3 months or 8 lbs  by July 4th   I have ordered a diagnostic mammogram on both breasts  You received the Prevnar vaccine, which is complementary to the Pneumovax vaccine and recommended because of your RA  This is Dr. Lupita Dawn version of a  "Low GI"  Weight loss Diet.  It is appropriate for all patients with normal renal function , gluten tolerance, and advised for patients who have prediabetes or diabetes:   All of the foods can be found at grocery stores and in bulk at [employer].  The Atkins protein bars and shakes are available in more varieties at Target, WalMart and [place].     7 AM Breakfast:  Choose from the following:  < 5 carbs  Weekdays: Low carbohydrate Protein  Shakes (EAS AdvantEdge "Carb  Control" shakes, Atkins,  Muscle Milk or Premier Protein shakes)     Weekends:  a scrambled egg/bacon/cheese burrito made with Mission's "carb  balance" whole wheat tortilla  (about 10 net carbs )  Eggs,  bacon /sausage , Joseph's pita /lavash bread or  (5 carbs)  A slice of fritatta ( egg based baked dish, no  crust:  google it) (< 10 carbs)   Avoid cereal and bananas, oatmeal and cream of wheat and grits. They are loaded with carbohydrates!   10 AM: high protein snack  (< 5 carbs)   Protein bar by Atkins  Or KIND  (the snack size, < 200 cal, usually < 6 carbs    A stick of cheese:  Around 1 carb,  100 cal      Other so called "protein bars" tend to be loaded with carbohydrates.  Remember, in food advertising, the word "energy" is synonymous for " carbohydrate."  Lunch:   A Sandwich using the bread choices listed, Can use any  Eggs,  lunchmeat, grilled meat or canned tuna).  Can add avocado, regular  mayo/mustard  and cheese.  A Salad using blue cheese, ranch,  Goddess dressing  or vinagrette,  No croutons or "confetti" and no "candied nuts" but regular nuts OK.   2 HARD BOILED EGG WHITES AND A CUP OF one of these greek yogurts:    dannon lt n fit greek yogurt         chobani 100 greek yogurt,    Oikos triple zero greek yogurt       No pretzels or chips.  Pickles and miniature sweet peppers are a good low  carb alternative that provide a "crunch"  The bread is the only source of carbohydrate in a sandwich and  can be decreased by trying some of these alternatives to traditional loaf bread:   Joseph's pita bread and Lavash (flat) bread :  50 cal and 4 net carbs  available at BJs and [employer]Mart.  Taste better when toasted, use as pita chips  T[employer] makes a variety of  flatbreads and  A PITA POCKET.    LOOK FOR  THE ONES THAT ARE 17 NET CARBS OR LESS    Mission makes 2 sizes of  Low carb whole wheat tortillas  (The large one  is  210 cal and 6 net carbs)   Avoid "Low fat dressings, as well as [place] dressings    3 PM/ Mid day  Snack:  Consider  1 ounce of  almonds, walnuts, pistachios, pecans, peanuts,  Macadamia nuts or a nut medley that does not contain raisins or cranberries.  No "granola"; the dried cranberries and raisins are loaded with carbohydrates. Mixed nuts as long as there are no raisins,  cranberries or dried fruit.    Try the prosciutto/mozzarella cheese sticks by [employer]  In deli /backery section   High protein   To avoid overindulging in snacks: Try drinking a glass of unsweeted almond/coconut milk  Or a cup of coffee with your Atkins chocolate bar to keep you from having 3!!!   Pork rinds!  Yes Pork Rinds are low carb potato chip substitute!   Toasted Joseph's flatbread with hummous dip (chickpeas)       6 PM  Dinner:     Meat/fowl/fish with a green salad, and either broccoli, cauliflower, green beans, spinach, brussel sprouts, bok choy or  [place]  beans. Fried in canola oil /olive oil BUT DO NOT BREAD THE PROTEIN!!      There is a low carb pasta by Dreamfield's that is acceptable and tastes great: only 5 digestible carbs/serving.( All grocery stores but BJs carry it )  Prepared Meals:  Try Hurley Cisco Angelo's chicken piccata or chicken or eggplant parm over low carb pasta.(Lowes and BJs)   Marjory Lies Sanchez's "Carnitas" (pulled pork, no sauce,  0 carbs) or his beef pot roast to make a dinner burrito (at [employer])  Barbecue with cole slaw is low carb BUT NO BUN!  SAME WITH HAMBURGERS     Whole wheat pasta is still full of digestible carbs and  Not as low in glycemic index as Dreamfield's.   Brown rice is still rice,  So skip the rice and noodles if you eat [place] or [place] (or at least limit to 1/2 cup)  9 PM snack :   Breyer's "low carb" fudgsicle or  ice cream bar (Carb Smart line), or  Weight  Watcher's ice cream bar , or another "no sugar added" ice cream;  a serving of fresh berries/cherries with whipped cream   Cheese or greek yogurt   8 ounces of [place] unsweetened almond/cococunut milk  Cheese and crackers (using WASA crackers,  They are low carb) or peanut butter on low carb crackers or pita bread     Avoid bananas, pineapple, grapes  and watermelon on a regular basis because they are high in sugar.  THINK OF THEM AS DESSERT and do not have daily   Remember that snack Substitutions should be less than 10 NET carbs per serving and meals should be < 20 net carbs. Remember that carbohydrates from fiber do not affect blood sugar, so you can  subtract fiber grams to get the "net carbs " of any particular food item.   Health Maintenance Adopting a healthy lifestyle and getting preventive care can go a long way to promote health and wellness. Talk with your health care provider about what schedule of regular examinations is right for you. This is a good chance for you to check in with your provider about disease prevention and staying  healthy. In between checkups, there are plenty of things you can do on your own. Experts have done a lot of research about which lifestyle changes and preventive measures are most likely to keep  you healthy. Ask your health care provider for more information. WEIGHT AND DIET  Eat a healthy diet  Be sure to include plenty of vegetables, fruits, low-fat dairy products, and lean protein.  Do not eat a lot of foods high in solid fats, added sugars, or salt.  Get regular exercise. This is one of the most important things you can do for your health.  Most adults should exercise for at least 150 minutes each week. The exercise should increase your heart rate and make you sweat (moderate-intensity exercise).  Most adults should also do strengthening exercises at least twice a week. This is in addition to the moderate-intensity exercise.  Maintain a healthy weight  Body mass index (BMI) is a measurement that can be used to identify possible weight problems. It estimates body fat based on height and weight. Your health care provider can help determine your BMI and help you achieve or maintain a healthy weight.  For females 62 years of age and older:   A BMI below 18.5 is considered underweight.  A BMI of 18.5 to 24.9 is normal.  A BMI of 25 to 29.9 is considered overweight.  A BMI of 30 and above is considered obese.  Watch levels of cholesterol and blood lipids  You should start having your blood tested for lipids and cholesterol at 64 years of age, then have this test every 5 years.  You may need to have your cholesterol levels checked more often if:  Your lipid or cholesterol levels are high.  You are older than 64 years of age.  You are at high risk for heart disease.  CANCER SCREENING   Lung Cancer  Lung cancer screening is recommended for adults 43-50 years old who are at high risk for lung cancer because of a history of smoking.  A yearly low-dose CT scan of the lungs is  recommended for people who:  Currently smoke.  Have quit within the past 15 years.  Have at least a 30-pack-year history of smoking. A pack year is smoking an average of one pack of cigarettes a day for 1 year.  Yearly screening should continue until it has been 15 years since you quit.  Yearly screening should stop if you develop a health problem that would prevent you from having lung cancer treatment.  Breast Cancer  Practice breast self-awareness. This means understanding how your breasts normally appear and feel.  It also means doing regular breast self-exams. Let your health care provider know about any changes, no matter how small.  If you are in your 20s or 30s, you should have a clinical breast exam (CBE) by a health care provider every 1-3 years as part of a regular health exam.  If you are 70 or older, have a CBE every year. Also consider having a breast X-ray (mammogram) every year.  If you have a family history of breast cancer, talk to your health care provider about genetic screening.  If you are at high risk for breast cancer, talk to your health care provider about having an MRI and a mammogram every year.  Breast cancer gene (BRCA) assessment is recommended for women who have family members with BRCA-related cancers. BRCA-related cancers include:  Breast.  Ovarian.  Tubal.  Peritoneal cancers.  Results of the assessment will determine the need for genetic counseling and BRCA1 and BRCA2 testing. Cervical Cancer Routine pelvic examinations to screen for cervical cancer are no longer recommended for nonpregnant women who are considered low  risk for cancer of the pelvic organs (ovaries, uterus, and vagina) and who do not have symptoms. A pelvic examination may be necessary if you have symptoms including those associated with pelvic infections. Ask your health care provider if a screening pelvic exam is right for you.   The Pap test is the screening test for  cervical cancer for women who are considered at risk.  If you had a hysterectomy for a problem that was not cancer or a condition that could lead to cancer, then you no longer need Pap tests.  If you are older than 65 years, and you have had normal Pap tests for the past 10 years, you no longer need to have Pap tests.  If you have had past treatment for cervical cancer or a condition that could lead to cancer, you need Pap tests and screening for cancer for at least 20 years after your treatment.  If you no longer get a Pap test, assess your risk factors if they change (such as having a new sexual partner). This can affect whether you should start being screened again.  Some women have medical problems that increase their chance of getting cervical cancer. If this is the case for you, your health care provider may recommend more frequent screening and Pap tests.  The human papillomavirus (HPV) test is another test that may be used for cervical cancer screening. The HPV test looks for the virus that can cause cell changes in the cervix. The cells collected during the Pap test can be tested for HPV.  The HPV test can be used to screen women 61 years of age and older. Getting tested for HPV can extend the interval between normal Pap tests from three to five years.  An HPV test also should be used to screen women of any age who have unclear Pap test results.  After 64 years of age, women should have HPV testing as often as Pap tests.  Colorectal Cancer  This type of cancer can be detected and often prevented.  Routine colorectal cancer screening usually begins at 64 years of age and continues through 64 years of age.  Your health care provider may recommend screening at an earlier age if you have risk factors for colon cancer.  Your health care provider may also recommend using home test kits to check for hidden blood in the stool.  A small camera at the end of a tube can be used to examine  your colon directly (sigmoidoscopy or colonoscopy). This is done to check for the earliest forms of colorectal cancer.  Routine screening usually begins at age 28.  Direct examination of the colon should be repeated every 5-10 years through 64 years of age. However, you may need to be screened more often if early forms of precancerous polyps or small growths are found. Skin Cancer  Check your skin from head to toe regularly.  Tell your health care provider about any new moles or changes in moles, especially if there is a change in a mole's shape or color.  Also tell your health care provider if you have a mole that is larger than the size of a pencil eraser.  Always use sunscreen. Apply sunscreen liberally and repeatedly throughout the day.  Protect yourself by wearing long sleeves, pants, a wide-brimmed hat, and sunglasses whenever you are outside. HEART DISEASE, DIABETES, AND HIGH BLOOD PRESSURE   Have your blood pressure checked at least every 1-2 years. High blood pressure causes heart  disease and increases the risk of stroke.  If you are between 5 years and 52 years old, ask your health care provider if you should take aspirin to prevent strokes.  Have regular diabetes screenings. This involves taking a blood sample to check your fasting blood sugar level.  If you are at a normal weight and have a low risk for diabetes, have this test once every three years after 64 years of age.  If you are overweight and have a high risk for diabetes, consider being tested at a younger age or more often. PREVENTING INFECTION  Hepatitis B  If you have a higher risk for hepatitis B, you should be screened for this virus. You are considered at high risk for hepatitis B if:  You were born in a country where hepatitis B is common. Ask your health care provider which countries are considered high risk.  Your parents were born in a high-risk country, and you have not been immunized against  hepatitis B (hepatitis B vaccine).  You have HIV or AIDS.  You use needles to inject street drugs.  You live with someone who has hepatitis B.  You have had sex with someone who has hepatitis B.  You get hemodialysis treatment.  You take certain medicines for conditions, including cancer, organ transplantation, and autoimmune conditions. Hepatitis C  Blood testing is recommended for:  Everyone born from [birth year] through 1965.  Anyone with known risk factors for hepatitis C. Sexually transmitted infections (STIs)  You should be screened for sexually transmitted infections (STIs) including gonorrhea and chlamydia if:  You are sexually active and are younger than 64 years of age.  You are older than 64 years of age and your health care provider tells you that you are at risk for this type of infection.  Your sexual activity has changed since you were last screened and you are at an increased risk for chlamydia or gonorrhea. Ask your health care provider if you are at risk.  If you do not have HIV, but are at risk, it may be recommended that you take a prescription medicine daily to prevent HIV infection. This is called pre-exposure prophylaxis (PrEP). You are considered at risk if:  You are sexually active and do not regularly use condoms or know the HIV status of your partner(s).  You take drugs by injection.  You are sexually active with a partner who has HIV. Talk with your health care provider about whether you are at high risk of being infected with HIV. If you choose to begin PrEP, you should first be tested for HIV. You should then be tested every 3 months for as long as you are taking PrEP.  PREGNANCY   If you are premenopausal and you may become pregnant, ask your health care provider about preconception counseling.  If you may become pregnant, take 400 to 800 micrograms (mcg) of folic acid every day.  If you want to prevent pregnancy, talk to your health care provider  about birth control (contraception). OSTEOPOROSIS AND MENOPAUSE   Osteoporosis is a disease in which the bones lose minerals and strength with aging. This can result in serious bone fractures. Your risk for osteoporosis can be identified using a bone density scan.  If you are 60 years of age or older, or if you are at risk for osteoporosis and fractures, ask your health care provider if you should be screened.  Ask your health care provider whether you should take a calcium or  vitamin D supplement to lower your risk for osteoporosis.  Menopause may have certain physical symptoms and risks.  Hormone replacement therapy may reduce some of these symptoms and risks. Talk to your health care provider about whether hormone replacement therapy is right for you.  HOME CARE INSTRUCTIONS   Schedule regular health, dental, and eye exams.  Stay current with your immunizations.   Do not use any tobacco products including cigarettes, chewing tobacco, or electronic cigarettes.  If you are pregnant, do not drink alcohol.  If you are breastfeeding, limit how much and how often you drink alcohol.  Limit alcohol intake to no more than 1 drink per day for nonpregnant women. One drink equals 12 ounces of beer, 5 ounces of wine, or 1 ounces of hard liquor.  Do not use street drugs.  Do not share needles.  Ask your health care provider for help if you need support or information about quitting drugs.  Tell your health care provider if you often feel depressed.  Tell your health care provider if you have ever been abused or do not feel safe at home. Document Released: 06/29/2011 Document Revised: 04/30/2014 Document Reviewed: 11/15/2013 [facility] Patient Information 2015 [place], [place]. This information is not intended to replace advice given to you by your health care provider. Make sure you discuss any questions you have with your health care provider.

## 2015-03-20 NOTE — Assessment & Plan Note (Signed)

## 2015-03-20 NOTE — Assessment & Plan Note (Addendum)
I have addressed  BMI and recommended wt loss of 10% of body weigh over the next 6 months using a low glycemic index diet and regular exercise a minimum of 5 days per week.  I have authorized the use of phentermine for 3 months  and a  return to see me in 3 months. Goal weight loss is 5% of today's weight by then or phentermine will be discontinued.

## 2015-03-20 NOTE — Assessment & Plan Note (Signed)
She is six months overdue for follow up .  Diagnostic bilateral mammogram ordered.

## 2015-04-30 ENCOUNTER — Telehealth: Payer: Self-pay | Admitting: Internal Medicine

## 2015-04-30 ENCOUNTER — Encounter: Payer: Self-pay | Admitting: Internal Medicine

## 2015-04-30 DIAGNOSIS — L03032 Cellulitis of left toe: Secondary | ICD-10-CM | POA: Insufficient documentation

## 2015-04-30 MED ORDER — CEPHALEXIN 500 MG PO CAPS: 500.0000 mg | ORAL_CAPSULE | Freq: Four times a day (QID) | ORAL | Status: DC

## 2015-04-30 NOTE — Telephone Encounter (Signed)
[facility] TELEPHONE ADVICE RECORD [facility]  Patient Name: Brooke Tucker  DOB: September 17, 1951    Initial Comment Caller states she his having some toe drainage; no injury;    Nurse Assessment  Nurse: Laural Benes RN, Dondra Spry Date/Time (Eastern Time): 04/30/2015 4:39:24 PM  Confirm and document reason for call. If symptomatic, describe symptoms. ---Arron's right great toe is infected and warm to touch; tried to cut her cuticle and got to close and caused it to be red warm and swelling  Has the patient traveled out of the country within the last 30 days? ---No  Does the patient require triage? ---Yes  Related visit to physician within the last 2 weeks? ---No  Does the PT have any chronic conditions? (i.e. diabetes, asthma, etc.) ---No     Guidelines    Guideline Title Affirmed Question Affirmed Notes       Final Disposition User

## 2015-04-30 NOTE — Telephone Encounter (Signed)
mychart message sent

## 2015-05-01 ENCOUNTER — Ambulatory Visit: Payer: Self-pay | Admitting: Internal Medicine

## 2015-05-01 NOTE — Telephone Encounter (Signed)
FYI, this is a duplicate message.  Spoke with pt, MD sent a Rx to drug store.

## 2015-06-21 ENCOUNTER — Ambulatory Visit (INDEPENDENT_AMBULATORY_CARE_PROVIDER_SITE_OTHER): Payer: BLUE CROSS/BLUE SHIELD | Admitting: Internal Medicine

## 2015-06-21 ENCOUNTER — Encounter: Payer: Self-pay | Admitting: Internal Medicine

## 2015-06-21 VITALS — BP 108/72 | HR 70 | Temp 98.0°F | Resp 14 | Ht 61.0 in | Wt 161.5 lb

## 2015-06-21 DIAGNOSIS — L22 Diaper dermatitis: Secondary | ICD-10-CM | POA: Diagnosis not present

## 2015-06-21 DIAGNOSIS — E669 Obesity, unspecified: Secondary | ICD-10-CM | POA: Diagnosis not present

## 2015-06-21 DIAGNOSIS — B372 Candidiasis of skin and nail: Secondary | ICD-10-CM | POA: Diagnosis not present

## 2015-06-21 MED ORDER — NYSTATIN 100000 UNIT/GM EX POWD: Freq: Two times a day (BID) | CUTANEOUS | Status: DC

## 2015-06-21 NOTE — Patient Instructions (Addendum)
Congratulations on your weight loss!!!!!!   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  After the redness resolves,  You can resume cornstarch or try Gold Bond with Zinc to prevent recurrence

## 2015-06-21 NOTE — Progress Notes (Signed)
Subjective:  Patient ID: Brooke Tucker, female    DOB: 08/14/1951  Age: 64 y.o. MRN: [id number]  CC: The primary encounter diagnosis was Diaper candidiasis. A diagnosis of Obesity (BMI 30-39.9) was also pertinent to this visit.  HPI Brooke Tucker presents for rash and follow up on obesity, naged with pharmacotherapy for appetite suppression  .  Has been exercising daily in the swimming pool,  Treading water for 45-60 mintues. Her rash developed after spending several hours in a wet bathing suit and she describes it as an itchy  Red area in her inguinal fold.  Using cornstarch.  Tried neosporin,  which apparently made it worse. The rash has not spread beyond the crease .   Outpatient Prescriptions Prior to Visit  Medication Sig Dispense Refill  . ALPRAZolam (XANAX) 0.5 MG tablet Take 1 tablet (0.5 mg total) by mouth at bedtime as needed for anxiety. 20 tablet 0  . Calcium Carbonate-Vitamin D (CALCIUM + D) 600-200 MG-UNIT TABS Take by mouth.      . ergocalciferol (DRISDOL) 50000 UNITS capsule Take 1 capsule (50,000 Units total) by mouth once a week. 12 capsule 0  . fluticasone (FLONASE) 50 MCG/ACT nasal spray Place 2 sprays into both nostrils daily. 48 g 3  . methotrexate (50 MG/ML) 1 G injection Inject 1 g into the vein once a week.    . Multiple Vitamin (MULTIVITAMIN) tablet Take 1 tablet by mouth daily.      . phentermine (ADIPEX-P) 37.5 MG tablet Take 0.5 tablets (18.75 mg total) by mouth 2 (two) times daily. 30 tablet 2  . cephALEXin (KEFLEX) 500 MG capsule Take 1 capsule (500 mg total) by mouth 4 (four) times daily. 28 capsule 0   No facility-administered medications prior to visit.    Review of Systems;  Patient denies headache, fevers, malaise, unintentional weight loss, skin rash, eye pain, sinus congestion and sinus pain, sore throat, dysphagia,  hemoptysis , cough, dyspnea, wheezing, chest pain, palpitations, orthopnea, edema, abdominal pain, nausea, melena, diarrhea,  constipation, flank pain, dysuria, hematuria, urinary  Frequency, nocturia, numbness, tingling, seizures,  Focal weakness, Loss of consciousness,  Tremor, insomnia, depression, anxiety, and suicidal ideation.      Objective:  BP 108/72 mmHg  Pulse 70  Temp(Src) 98 F (36.7 C) (Oral)  Resp 14  Ht 5\' 1"  (1.549 m)  Wt 161 lb 8 oz (73.256 kg)  BMI 30.53 kg/m2  SpO2 97%  BP Readings from Last 3 Encounters:  06/21/15 108/72  03/19/15 110/78  10/23/13 118/76    Wt Readings from Last 3 Encounters:  06/21/15 161 lb 8 oz (73.256 kg)  03/19/15 174 lb 12 oz (79.266 kg)  10/23/13 161 lb 8 oz (73.256 kg)    General appearance: alert, cooperative and appears stated age Back: symmetric, no curvature. ROM normal. No CVA tenderness. Lungs: clear to auscultation bilaterally Heart: regular rate and rhythm, S1, S2 normal, no murmur, click, rub or gallop Abdomen: soft, non-tender; bowel sounds normal; no masses,  no organomegaly Pulses: 2+ and symmetric Skin: red macular rash in bilateral inguinal folds.  No blisters or lesions .  Lymph nodes: Cervical, supraclavicular, and axillary nodes normal.  No results found for: HGBA1C  Lab Results  Component Value Date   CREATININE 0.71 03/15/2015   CREATININE 0.7 10/19/2013   CREATININE 0.6 10/14/2012    Lab Results  Component Value Date   WBC 6.0 03/15/2015   HGB 13.3 03/15/2015   HCT 40.1 03/15/2015   PLT 211.0  03/15/2015   GLUCOSE 91 03/15/2015   CHOL 195 03/15/2015   TRIG 93.0 03/15/2015   HDL 82.70 03/15/2015   LDLDIRECT 101.7 10/19/2013   LDLCALC 94 03/15/2015   ALT 12 03/15/2015   AST 16 03/15/2015   NA 138 03/15/2015   K 4.3 03/15/2015   CL 103 03/15/2015   CREATININE 0.71 03/15/2015   BUN 9 03/15/2015   CO2 30 03/15/2015   TSH 1.10 03/15/2015    No results found.  Assessment & Plan:   Problem List Items Addressed This Visit      Unprioritized   Obesity (BMI 30-39.9)    I have congratulated her in reduction of    BMI and encouraged  Continued weight loss with goal of 10% of body weight over the next 6 months using a low glycemic index diet and regular exercise a minimum of 5 days per week.   Body mass index is 30.53 kg/(m^2).   Wt Readings from Last 3 Encounters:  06/21/15 161 lb 8 oz (73.256 kg)  03/19/15 174 lb 12 oz (79.266 kg)  10/23/13 161 lb 8 oz (73.256 kg)         Diaper candidiasis - Primary    Trial of nystatin powder bid until resolved ,  With subsequent  use of Gold Bond with zinc       Relevant Medications   nystatin (MYCOSTATIN) powder      I have discontinued Ms. Karim's cephALEXin. I am also having her start on nystatin. Additionally, I am having her maintain her Calcium Carbonate-Vitamin D, multivitamin, methotrexate, ALPRAZolam, ergocalciferol, fluticasone, and phentermine.  Meds ordered this encounter  Medications  . nystatin (MYCOSTATIN) powder    Sig: Apply topically 2 (two) times daily.    Dispense:  15 g    Refill:  3    Medications Discontinued During This Encounter  Medication Reason  . cephALEXin (KEFLEX) 500 MG capsule     Follow-up: No Follow-up on file.   Sherlene Shams, MD

## 2015-06-24 ENCOUNTER — Encounter: Payer: Self-pay | Admitting: Internal Medicine

## 2015-06-24 NOTE — Addendum Note (Signed)
Addended by: Sherlene Shams on: 06/24/2015 04:27 PM   Modules accepted: Level of Service

## 2015-06-24 NOTE — Assessment & Plan Note (Signed)
Trial of nystatin powder bid until resolved ,  With subsequent  use of Gold Bond with zinc

## 2015-06-24 NOTE — Assessment & Plan Note (Signed)
I have congratulated her in reduction of   BMI and encouraged  Continued weight loss with goal of 10% of body weight over the next 6 months using a low glycemic index diet and regular exercise a minimum of 5 days per week.   Body mass index is 30.53 kg/(m^2).   Wt Readings from Last 3 Encounters:  06/21/15 161 lb 8 oz (73.256 kg)  03/19/15 174 lb 12 oz (79.266 kg)  10/23/13 161 lb 8 oz (73.256 kg)

## 2015-07-03 ENCOUNTER — Ambulatory Visit: Payer: Self-pay | Admitting: Internal Medicine

## 2015-07-25 ENCOUNTER — Encounter: Payer: Self-pay | Admitting: Internal Medicine

## 2015-09-26 ENCOUNTER — Other Ambulatory Visit: Payer: Self-pay | Admitting: Rheumatology

## 2015-09-26 DIAGNOSIS — G8929 Other chronic pain: Secondary | ICD-10-CM

## 2015-09-26 DIAGNOSIS — M25562 Pain in left knee: Principal | ICD-10-CM

## 2015-09-27 ENCOUNTER — Ambulatory Visit (INDEPENDENT_AMBULATORY_CARE_PROVIDER_SITE_OTHER): Payer: PRIVATE HEALTH INSURANCE | Admitting: Internal Medicine

## 2015-09-27 ENCOUNTER — Encounter: Payer: Self-pay | Admitting: Internal Medicine

## 2015-09-27 VITALS — BP 118/62 | HR 72 | Temp 98.1°F | Wt 159.8 lb

## 2015-09-27 DIAGNOSIS — M25562 Pain in left knee: Secondary | ICD-10-CM | POA: Insufficient documentation

## 2015-09-27 DIAGNOSIS — E669 Obesity, unspecified: Secondary | ICD-10-CM | POA: Diagnosis not present

## 2015-09-27 MED ORDER — PHENTERMINE HCL 15 MG PO TBDP: 1.0000 | ORAL_TABLET | Freq: Every day | ORAL | Status: DC

## 2015-09-27 MED ORDER — ALPRAZOLAM 0.5 MG PO TABS: 0.5000 mg | ORAL_TABLET | Freq: Every evening | ORAL | Status: DC | PRN

## 2015-09-27 NOTE — Progress Notes (Signed)
Subjective:  Patient ID: Brooke Tucker, female    DOB: 10-29-1951  Age: 64 y.o. MRN: [id number]  CC: The primary encounter diagnosis was Left knee pain. Diagnoses of Knee pain, left anterior and Obesity (BMI 30-39.9) were also pertinent to this visit.  HPI [name] presents for follow up on chronic conditions including obesity, currently managed with pharmacueticcal assistance.    She has net of  2 lb weight loss since June,  goal was 5% or  8 lbs.  Previous 3 months goal was met and 13 lbs were lost.   Her efforts were diminished over the last few weeks  Due to inactivity resulting from persistent left knee pain.   She developed medial pain and her rheumatologist gave her  A steroid injection , which resulted in resolution of medial pain but  new onset lateral knee pain. .   MRi was advised but deferred.  She stopped the phentermine on  Sept 1 and since then her weight has plateaued .    Outpatient Prescriptions Prior to Visit  Medication Sig Dispense Refill  . Calcium Carbonate-Vitamin D (CALCIUM + D) 600-200 MG-UNIT TABS Take by mouth.      . ergocalciferol (DRISDOL) 50000 UNITS capsule Take 1 capsule (50,000 Units total) by mouth once a week. 12 capsule 0  . fluticasone (FLONASE) 50 MCG/ACT nasal spray Place 2 sprays into both nostrils daily. 48 g 3  . methotrexate (50 MG/ML) 1 G injection Inject 1 g into the vein once a week.    . Multiple Vitamin (MULTIVITAMIN) tablet Take 1 tablet by mouth daily.      Marland Kitchen nystatin (MYCOSTATIN) powder Apply topically 2 (two) times daily. 15 g 3  . ALPRAZolam (XANAX) 0.5 MG tablet Take 1 tablet (0.5 mg total) by mouth at bedtime as needed for anxiety. 20 tablet 0  . phentermine (ADIPEX-P) 37.5 MG tablet Take 0.5 tablets (18.75 mg total) by mouth 2 (two) times daily. 30 tablet 2   No facility-administered medications prior to visit.    Review of Systems;  Patient denies headache, fevers, malaise, unintentional weight loss, skin rash, eye  pain, sinus congestion and sinus pain, sore throat, dysphagia,  hemoptysis , cough, dyspnea, wheezing, chest pain, palpitations, orthopnea, edema, abdominal pain, nausea, melena, diarrhea, constipation, flank pain, dysuria, hematuria, urinary  Frequency, nocturia, numbness, tingling, seizures,  Focal weakness, Loss of consciousness,  Tremor, insomnia, depression, anxiety, and suicidal ideation.      Objective:  BP 118/62 mmHg  Pulse 72  Temp(Src) 98.1 F (36.7 C) (Oral)  Wt 159 lb 12.8 oz (72.485 kg)  SpO2 98%  BP Readings from Last 3 Encounters:  09/27/15 118/62  06/21/15 108/72  03/19/15 110/78    Wt Readings from Last 3 Encounters:  09/27/15 159 lb 12.8 oz (72.485 kg)  06/21/15 161 lb 8 oz (73.256 kg)  03/19/15 174 lb 12 oz (79.266 kg)    General appearance: alert, cooperative and appears stated age Neck: no adenopathy, no carotid bruit, supple, symmetrical, trachea midline and thyroid not enlarged, symmetric, no tenderness/mass/nodules Back: symmetric, no curvature. ROM normal. No CVA tenderness. Lungs: clear to auscultation bilaterally Heart: regular rate and rhythm, S1, S2 normal, no murmur, click, rub or gallop Abdomen: soft, non-tender; bowel sounds normal; no masses,  no organomegaly Pulses: 2+ and symmetric MSK: bilateral crepitus, left lateral knee pain with palpation and flexion, no effusion.  Skin: Skin color, texture, turgor normal. No rashes or lesions  No results found for: HGBA1C  Lab Results  Component Value Date   CREATININE 0.71 03/15/2015   CREATININE 0.7 10/19/2013   CREATININE 0.6 10/14/2012    Lab Results  Component Value Date   WBC 6.0 03/15/2015   HGB 13.3 03/15/2015   HCT 40.1 03/15/2015   PLT 211.0 03/15/2015   GLUCOSE 91 03/15/2015   CHOL 195 03/15/2015   TRIG 93.0 03/15/2015   HDL 82.70 03/15/2015   LDLDIRECT 101.7 10/19/2013   LDLCALC 94 03/15/2015   ALT 12 03/15/2015   AST 16 03/15/2015   NA 138 03/15/2015   K 4.3 03/15/2015    CL 103 03/15/2015   CREATININE 0.71 03/15/2015   BUN 9 03/15/2015   CO2 30 03/15/2015   TSH 1.10 03/15/2015    No results found.  Assessment & Plan:   Problem List Items Addressed This Visit    Obesity (BMI 30-39.9)    I have refilled the phentermine at a lower dose for  Continued weight loss with goal of 10% of body weight over the next 6 months using a low glycemic index diet and regular exercise a minimum of 5 days per week.   Body mass index is 30.21 kg/(m^2).   Wt Readings from Last 3 Encounters:  09/27/15 159 lb 12.8 oz (72.485 kg)  06/21/15 161 lb 8 oz (73.256 kg)  03/19/15 174 lb 12 oz (79.266 kg)           Relevant Medications   Phentermine HCl 15 MG TBDP   Knee pain, left anterior    Suggestive of tendon sprain given improvement with oral prednisone . Referral to Charlann Boxer        Other Visit Diagnoses    Left knee pain    -  Primary    Relevant Orders    Ambulatory referral to Sports Medicine      I have discontinued Ms. Paul's phentermine. I am also having her start on Phentermine HCl. Additionally, I am having her maintain her Calcium Carbonate-Vitamin D, multivitamin, methotrexate, ergocalciferol, fluticasone, nystatin, and ALPRAZolam.  Meds ordered this encounter  Medications  . Phentermine HCl 15 MG TBDP    Sig: Take 1 tablet by mouth daily.    Dispense:  30 tablet    Refill:  2  . ALPRAZolam (XANAX) 0.5 MG tablet    Sig: Take 1 tablet (0.5 mg total) by mouth at bedtime as needed for anxiety.    Dispense:  20 tablet    Refill:  0    Medications Discontinued During This Encounter  Medication Reason  . phentermine (ADIPEX-P) 37.5 MG tablet   . ALPRAZolam (XANAX) 0.5 MG tablet Reorder    Follow-up: Return in about 3 months (around 12/27/2015).   Crecencio Mc, MD

## 2015-09-27 NOTE — Patient Instructions (Addendum)
  I have refilled  phentermine for 3 more months.  Return to see me in 3 months. Goal weight loss is 7 lbs by then  Or we will have to stop the medication   You will need to give up traditional ice cream.  Tyr the [employer] Carb Smart Line or the [employer] Free [place] with added whipped cream instead  Referral to Dr Terrilee Files for knee pain  Referral to [facility] for Brooke Tucker's lymphedema

## 2015-09-27 NOTE — Assessment & Plan Note (Signed)
Suggestive of tendon sprain given improvement with oral prednisone . Referral to Terrilee Files

## 2015-09-27 NOTE — Assessment & Plan Note (Signed)
I have refilled the phentermine at a lower dose for  Continued weight loss with goal of 10% of body weight over the next 6 months using a low glycemic index diet and regular exercise a minimum of 5 days per week.   Body mass index is 30.21 kg/(m^2).   Wt Readings from Last 3 Encounters:  09/27/15 159 lb 12.8 oz (72.485 kg)  06/21/15 161 lb 8 oz (73.256 kg)  03/19/15 174 lb 12 oz (79.266 kg)

## 2015-10-10 ENCOUNTER — Encounter: Payer: Self-pay | Admitting: Family Medicine

## 2015-10-10 ENCOUNTER — Ambulatory Visit (INDEPENDENT_AMBULATORY_CARE_PROVIDER_SITE_OTHER): Payer: PRIVATE HEALTH INSURANCE | Admitting: Family Medicine

## 2015-10-10 ENCOUNTER — Other Ambulatory Visit (INDEPENDENT_AMBULATORY_CARE_PROVIDER_SITE_OTHER): Payer: PRIVATE HEALTH INSURANCE

## 2015-10-10 VITALS — BP 136/82 | HR 76 | Ht 61.0 in | Wt 162.0 lb

## 2015-10-10 DIAGNOSIS — M23301 Other meniscus derangements, unspecified lateral meniscus, left knee: Secondary | ICD-10-CM

## 2015-10-10 DIAGNOSIS — S83422A Sprain of lateral collateral ligament of left knee, initial encounter: Secondary | ICD-10-CM

## 2015-10-10 DIAGNOSIS — M25562 Pain in left knee: Secondary | ICD-10-CM

## 2015-10-10 DIAGNOSIS — M23302 Other meniscus derangements, unspecified lateral meniscus, unspecified knee: Secondary | ICD-10-CM | POA: Insufficient documentation

## 2015-10-10 DIAGNOSIS — S83429A Sprain of lateral collateral ligament of unspecified knee, initial encounter: Secondary | ICD-10-CM | POA: Insufficient documentation

## 2015-10-10 DIAGNOSIS — M23304 Other meniscus derangements, unspecified medial meniscus, left knee: Secondary | ICD-10-CM

## 2015-10-10 DIAGNOSIS — M23307 Other meniscus derangements, unspecified meniscus, left knee: Secondary | ICD-10-CM | POA: Diagnosis not present

## 2015-10-10 MED ORDER — MELOXICAM 15 MG PO TABS: 15.0000 mg | ORAL_TABLET | Freq: Every day | ORAL | Status: DC

## 2015-10-10 NOTE — Patient Instructions (Addendum)
Good to see you Lateral meniscal tear with LCL partial tear.  Ice 20 minutes 2 times daily. Usually after activity and before bed. Exercises 3 times a week.  Wear brace daily Turmeric 500mg  twice daily Melxoicam daily for 10 days then as needed See me again in 3 weeks and will make sure you are healing.

## 2015-10-10 NOTE — Progress Notes (Signed)
Pre visit review using our clinic review tool, if applicable. No additional management support is needed unless otherwise documented below in the visit note. 

## 2015-10-10 NOTE — Progress Notes (Signed)
Brooke Tucker [address] [place], [place] [address] Phone: [phone] Subjective:    I'm seeing this patient by the request  of:  TULLO, Mar Daring, MD   CC: Left knee pain  Brooke Tucker is a 64 y.o. female coming in with complaint of left knee pain. Patient states that she's had this pain for 2 months. Patient was doing a significant amount of swimming and noticed some mild discomfort. Seemed to start on the medial aspect of the knee and now more on the lateral aspect of the knee. Does not remember one specific incident. Patient has seen another provider who has ordered an MRI but she wanted further evaluation. Was given an injection proximal in one month ago but did not seem to help significantly. Patient does state that it does respond anti-inflammatories. Past medical history significant for rheumatoid arthritis. Patient states that sometimes the knee feels unstable but has not given out on her. Rates the severity of pain a 7 out of 10. Seems to be worsening over the course of time. Sometimes a sharp pain but usually a dull aching pain. Now even affecting her ambulation.  Past Medical History  Diagnosis Date  . Hypertension   . Ovarian cyst July 2008    laparscopic biopsy normal  . Rheumatoid arthritis(714.0)     managed by Beverley Fiedler with MTX   Past Surgical History  Procedure Laterality Date  . Esophagogastroduodenoscopy  July 2008    normal  . Combined hysteroscopy diagnostic / d&c  July 2008    [facility]  . Excision morton's neuroma      Left foot  . Metatarsal osteotomy  dec 2012    right foot, 5th MT  [name])   Social History  Substance Use Topics  . Smoking status: Never Smoker   . Smokeless tobacco: Never Used  . Alcohol Use: Yes     Comment: occasional   Allergies  Allergen Reactions  . Naproxen Nausea And Vomiting  . Vicodin [Hydrocodone-Acetaminophen]    Family History  Problem Relation Age of Onset  .  Diabetes Father   . Heart disease Father   . Cancer Maternal Aunt     Breast, metastatic  . Cancer Maternal Grandmother     ovarian        Past medical history, social, surgical and family history all reviewed in electronic medical record.   Review of Systems: No headache, visual changes, nausea, vomiting, diarrhea, constipation, dizziness, abdominal pain, skin rash, fevers, chills, night sweats, weight loss, swollen lymph nodes, body aches, joint swelling, muscle aches, chest pain, shortness of breath, mood changes.   Objective Blood pressure 136/82, pulse 76, height 5\' 1"  (1.549 m), weight 162 lb (73.483 kg), SpO2 97 %.  General: No apparent distress alert and oriented x3 mood and affect normal, dressed appropriately.  HEENT: Pupils equal, extraocular movements intact  Respiratory: Patient's speak in full sentences and does not appear short of breath  Cardiovascular: No lower extremity edema, non tender, no erythema  Skin: Warm dry intact with no signs of infection or rash on extremities or on axial skeleton.  Abdomen: Soft nontender  Neuro: Cranial nerves II through XII are intact, neurovascularly intact in all extremities with 2+ DTRs and 2+ pulses.  Lymph: No lymphadenopathy of posterior or anterior cervical chain or axillae bilaterally.  Gait normal with good balance and coordination.  MSK:  Non tender with full range of motion and good stability and symmetric strength and  tone of shoulders, elbows, wrist, hip, and ankles bilaterally.   Knee: Left Deformity of the knee noted. Tenderness to palpation over the medial and lateral joint lines. ROM full in flexion and extension and lower leg rotation. Laxity of the LCL noted Positive Mcmurray's, Apley's, and Thessalonian tests. Non painful patellar compression. Patellar glide with severe crepitus noted Patellar and quadriceps tendons unremarkable. Hamstring and quadriceps strength is normal.   Contralateral knee mild deformity  but no pain. For range of motion.  MSK US performed of: Left knee This study was ordered, performed, and interpreted by Terrilee Files D.O.  Knee: All structures visualized. Patient does have degenerative tear of the lateral meniscus with 25% displacement. Hypoechoic changes noted. Patellar Tendon unremarkable on long and transverse views without effusion. No abnormality of prepatellar bursa. Mild synovitis noted Patient does have an LCL tear of approximately 50-70% of the tendon on the articular side distally. No abnormality of origin of medial or lateral head of the gastrocnemius.  IMPRESSION: LCL partial tear and lateral meniscal tear.  Procedure note 97110; 15 minutes spent for Therapeutic exercises as stated in above notes.  This included exercises focusing on stretching, strengthening, with significant focus on eccentric aspects.  Flexion and extension exercises focusing on the vastus medialis oblique as well as the hip abductors. Discussed stabilization with proper shoes. Proper technique shown and discussed handout in great detail with ATC.  All questions were discussed and answered.     Impression and Recommendations:     This case required medical decision making of moderate complexity.

## 2015-10-10 NOTE — Assessment & Plan Note (Signed)
We'll continue to monitor. Recheck with the ultrasound in 3 weeks. Can consider another injection if necessary. 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

## 2015-10-10 NOTE — Assessment & Plan Note (Signed)
Does have partial tear with patient also having a lateral meniscal tear. I think that this is causing some mild instability. Likely going to affect healing is her rheumatoid arthritis. We discussed bracing, home exercise, icing protocol. Patient given a short course of anti-inflammatories. We also discussed with patient about over-the-counter natural supplementations. Patient come back again in 3 weeks for further evaluation and treatment.

## 2015-10-31 ENCOUNTER — Other Ambulatory Visit (INDEPENDENT_AMBULATORY_CARE_PROVIDER_SITE_OTHER): Payer: PRIVATE HEALTH INSURANCE

## 2015-10-31 ENCOUNTER — Ambulatory Visit (INDEPENDENT_AMBULATORY_CARE_PROVIDER_SITE_OTHER): Payer: PRIVATE HEALTH INSURANCE | Admitting: Family Medicine

## 2015-10-31 ENCOUNTER — Encounter: Payer: Self-pay | Admitting: Family Medicine

## 2015-10-31 VITALS — BP 128/84 | HR 76 | Ht 61.0 in | Wt 167.0 lb

## 2015-10-31 DIAGNOSIS — M25562 Pain in left knee: Secondary | ICD-10-CM | POA: Diagnosis not present

## 2015-10-31 DIAGNOSIS — M23301 Other meniscus derangements, unspecified lateral meniscus, left knee: Secondary | ICD-10-CM

## 2015-10-31 DIAGNOSIS — M23307 Other meniscus derangements, unspecified meniscus, left knee: Secondary | ICD-10-CM | POA: Diagnosis not present

## 2015-10-31 DIAGNOSIS — M23304 Other meniscus derangements, unspecified medial meniscus, left knee: Secondary | ICD-10-CM | POA: Diagnosis not present

## 2015-10-31 NOTE — Progress Notes (Signed)
Pre visit review using our clinic review tool, if applicable. No additional management support is needed unless otherwise documented below in the visit note. 

## 2015-10-31 NOTE — Assessment & Plan Note (Signed)
Patient does have a fairly large tear still present. Increasing Doppler flow noted. There is minimal displacement at this time and that seems to be improving. Patient has not done all her regular activities but has been taking it easy. Do think the patient will likely heal but this will take some more between another 6-12 weeks. Patient is not ready to do formal physical therapy at this point. Patient will now start increasing her activity slowly. Patient given an exercise prescription. Given topical anti-inflammatories to try. Patient and will come back and see me again in 4 weeks for further evaluation and treatment or call me sooner and if worsening symptoms such as buckling advance imaging would be warranted. Spent  25 minutes with patient face-to-face and had greater than 50% of counseling including as described above in assessment and plan.

## 2015-10-31 NOTE — Patient Instructions (Signed)
Good to see you Ice is your friend OK to do biking with no resistance and or walking in pool OK to walk one day a week for 2 weeks then as 2 times a week until I see you Continue the vitamin D  pennsaid pinkie amount topically 2 times daily as needed.  See me again in 4 weeks and hopefully much better

## 2015-10-31 NOTE — Progress Notes (Signed)
[name] [address] [place], [place] [address] Phone: [phone] Subjective:    I'm seeing this patient by the request  of:  TULLO, Mar Daring, MD   CC: Left knee pain  ATF:TDDUKGURKY EKAM BONEBRAKE is a 64 y.o. female coming in with complaint of left knee pain. Patient was found to have lateral meniscal tear as well as an LCL tear. Patient has been in a hinged brace and decreased her activity. States that she is feeling better. Braces help to go from a seated to standing position. No significant instability. Denies any pain that is waking her up at night. Feels that the swelling that she had previously has improved. No new symptoms.  Past Medical History  Diagnosis Date  . Hypertension   . Ovarian cyst July 2008    laparscopic biopsy normal  . Rheumatoid arthritis(714.0)     managed by Beverley Fiedler with [facility]   Past Surgical History  Procedure Laterality Date  . Esophagogastroduodenoscopy  July 2008    normal  . Combined hysteroscopy diagnostic / d&c  July 2008    [place]  . Excision morton's neuroma      Left foot  . Metatarsal osteotomy  dec 2012    right foot, 5th MT  [name])   Social History  Substance Use Topics  . Smoking status: Never Smoker   . Smokeless tobacco: Never Used  . Alcohol Use: Yes     Comment: occasional   Allergies  Allergen Reactions  . Naproxen Nausea And Vomiting  . Vicodin [Hydrocodone-Acetaminophen]    Family History  Problem Relation Age of Onset  . Diabetes Father   . Heart disease Father   . Cancer Maternal Aunt     Breast, metastatic  . Cancer Maternal Grandmother     ovarian        Past medical history, social, surgical and family history all reviewed in electronic medical record.   Review of Systems: No headache, visual changes, nausea, vomiting, diarrhea, constipation, dizziness, abdominal pain, skin rash, fevers, chills, night sweats, weight loss, swollen lymph nodes, body aches, joint  swelling, muscle aches, chest pain, shortness of breath, mood changes.   Objective Blood pressure 128/84, pulse 76, height 5\' 1"  (1.549 m), weight 167 lb (75.751 kg), SpO2 97 %.  General: No apparent distress alert and oriented x3 mood and affect normal, dressed appropriately.  HEENT: Pupils equal, extraocular movements intact  Respiratory: Patient's speak in full sentences and does not appear short of breath  Cardiovascular: No lower extremity edema, non tender, no erythema  Skin: Warm dry intact with no signs of infection or rash on extremities or on axial skeleton.  Abdomen: Soft nontender  Neuro: Cranial nerves II through XII are intact, neurovascularly intact in all extremities with 2+ DTRs and 2+ pulses.  Lymph: No lymphadenopathy of posterior or anterior cervical chain or axillae bilaterally.  Gait normal with good balance and coordination.  MSK:  Non tender with full range of motion and good stability and symmetric strength and tone of shoulders, elbows, wrist, hip, and ankles bilaterally.   Knee: Left Valgus Deformity of the knee noted. Tenderness to palpation over the medial and lateral joint lines. ROM full in flexion and extension and lower leg rotation. Mild laxity of the LCL but does have a true endpoint Positive Mcmurray's, Apley's, and Thessalonian tests. Non painful patellar compression. Patellar glide with severe crepitus noted Patellar and quadriceps tendons unremarkable. Hamstring and quadriceps strength is normal.  Contralateral knee mild deformity but no pain. For range of motion.  MSK US performed of: Left knee This study was ordered, performed, and interpreted by Terrilee Files D.O.  Knee: All structures visualized. Patient does have degenerative tear of the lateral meniscus with severe increasing Doppler flow. Despite minimally temperature which is an improvement. Hypoechoic changes noted. Patellar Tendon unremarkable on long and transverse views without  effusion. No abnormality of prepatellar bursa. Mild synovitis noted Patient does have an LCL tear of approximately 50-70% of the tendon on the articular side distally. No abnormality of origin of medial or lateral head of the gastrocnemius.  IMPRESSION: 50% healing of the partial tear of the LCL but continued lateral meniscal tear but less displacement      Impression and Recommendations:     This case required medical decision making of moderate complexity.

## 2015-11-01 ENCOUNTER — Telehealth: Payer: Self-pay | Admitting: Family Medicine

## 2015-11-01 NOTE — Telephone Encounter (Signed)
Pt wants to speak to the assistant concern about the MRI that they put off. Please call her back  Phone # [phone]

## 2015-11-04 NOTE — Telephone Encounter (Signed)
Spoke with pt, she would like to go ahead & have an MRI. Her rheumatologist has already entered an order for the MRI so she is going to call [facility] to schedule an appt. Advised pt to let us know after she has the MRI so Dr. Katrinka Blazing can look at the results & go from there. Pt understood.

## 2015-11-16 ENCOUNTER — Ambulatory Visit
Admission: RE | Admit: 2015-11-16 | Discharge: 2015-11-16 | Disposition: A | Discharge status: Home / Self Care | Payer: PRIVATE HEALTH INSURANCE | Source: Ambulatory Visit | Attending: Rheumatology | Admitting: Rheumatology

## 2015-11-16 DIAGNOSIS — M25562 Pain in left knee: Principal | ICD-10-CM

## 2015-11-16 DIAGNOSIS — G8929 Other chronic pain: Secondary | ICD-10-CM

## 2015-12-03 ENCOUNTER — Ambulatory Visit: Payer: PRIVATE HEALTH INSURANCE | Admitting: Family Medicine

## 2015-12-12 ENCOUNTER — Encounter: Payer: Self-pay | Admitting: Internal Medicine

## 2015-12-12 DIAGNOSIS — R5383 Other fatigue: Secondary | ICD-10-CM

## 2015-12-12 DIAGNOSIS — E559 Vitamin D deficiency, unspecified: Secondary | ICD-10-CM

## 2015-12-13 ENCOUNTER — Other Ambulatory Visit: Payer: PRIVATE HEALTH INSURANCE

## 2015-12-13 NOTE — Telephone Encounter (Signed)
Your labs have been ordered,  Including vit d . No fasting is required. Please call office to make appt for lab visit if not already done

## 2015-12-17 ENCOUNTER — Other Ambulatory Visit (INDEPENDENT_AMBULATORY_CARE_PROVIDER_SITE_OTHER): Payer: PRIVATE HEALTH INSURANCE

## 2015-12-17 DIAGNOSIS — R5383 Other fatigue: Secondary | ICD-10-CM | POA: Diagnosis not present

## 2015-12-17 DIAGNOSIS — E559 Vitamin D deficiency, unspecified: Secondary | ICD-10-CM

## 2015-12-17 LAB — CBC WITH DIFFERENTIAL/PLATELET
BASOS ABS: 0 10*3/uL (ref 0.0–0.1)
Basophils Relative: 0.7 % (ref 0.0–3.0)
EOS PCT: 3.3 % (ref 0.0–5.0)
Eosinophils Absolute: 0.2 10*3/uL (ref 0.0–0.7)
HCT: 37.7 % (ref 36.0–46.0)
HEMOGLOBIN: 12.2 g/dL (ref 12.0–15.0)
Lymphocytes Relative: 28.1 % (ref 12.0–46.0)
Lymphs Abs: 1.9 10*3/uL (ref 0.7–4.0)
MCHC: 32.5 g/dL (ref 30.0–36.0)
MCV: 92.9 fl (ref 78.0–100.0)
MONO ABS: 0.6 10*3/uL (ref 0.1–1.0)
MONOS PCT: 8.3 % (ref 3.0–12.0)
NEUTROS PCT: 59.6 % (ref 43.0–77.0)
Neutro Abs: 4.1 10*3/uL (ref 1.4–7.7)
Platelets: 206 10*3/uL (ref 150.0–400.0)
RBC: 4.06 Mil/uL (ref 3.87–5.11)
RDW: 14.2 % (ref 11.5–15.5)
WBC: 6.9 10*3/uL (ref 4.0–10.5)

## 2015-12-17 LAB — COMPREHENSIVE METABOLIC PANEL
ALBUMIN: 3.8 g/dL (ref 3.5–5.2)
ALT: 10 U/L (ref 0–35)
AST: 15 U/L (ref 0–37)
Alkaline Phosphatase: 68 U/L (ref 39–117)
BILIRUBIN TOTAL: 0.5 mg/dL (ref 0.2–1.2)
BUN: 11 mg/dL (ref 6–23)
CALCIUM: 9.1 mg/dL (ref 8.4–10.5)
CHLORIDE: 105 meq/L (ref 96–112)
CO2: 29 meq/L (ref 19–32)
Creatinine, Ser: 0.66 mg/dL (ref 0.40–1.20)
GFR: 95.74 mL/min (ref >60.00)
Glucose, Bld: 85 mg/dL (ref 70–99)
Potassium: 4 mEq/L (ref 3.5–5.1)
Sodium: 140 mEq/L (ref 135–145)
Total Protein: 6.9 g/dL (ref 6.0–8.3)

## 2015-12-17 LAB — VITAMIN B12: VITAMIN B 12: 346 pg/mL (ref 211–911)

## 2015-12-17 LAB — TSH: TSH: 0.88 u[IU]/mL (ref 0.35–4.50)

## 2015-12-17 LAB — VITAMIN D 25 HYDROXY (VIT D DEFICIENCY, FRACTURES): VITD: 37.46 ng/mL (ref 30.00–100.00)

## 2015-12-19 ENCOUNTER — Other Ambulatory Visit: Payer: PRIVATE HEALTH INSURANCE

## 2015-12-22 ENCOUNTER — Encounter: Payer: Self-pay | Admitting: Internal Medicine

## 2015-12-26 ENCOUNTER — Encounter: Payer: Self-pay | Admitting: *Deleted

## 2015-12-26 ENCOUNTER — Other Ambulatory Visit: Payer: PRIVATE HEALTH INSURANCE

## 2015-12-26 NOTE — Patient Instructions (Signed)
  Your procedure is scheduled on: 01-07-16 Report to [facility] 2ND FLOOR. To find out your arrival time please call [phone] between 1PM - 3PM on 01-06-16  Remember: Instructions that are not followed completely may result in serious medical risk, up to and including death, or upon the discretion of your surgeon and anesthesiologist your surgery may need to be rescheduled.    _X___ 1. Do not eat food or drink liquids after midnight. No gum chewing or hard candies.     _X___ 2. No Alcohol for 24 hours before or after surgery.   ____ 3. Bring all medications with you on the day of surgery if instructed.    ____ 4. Notify your doctor if there is any change in your medical condition     (cold, fever, infections).     Do not wear jewelry, make-up, hairpins, clips or nail polish.  Do not wear lotions, powders, or perfumes. You may wear deodorant.  Do not shave 48 hours prior to surgery. Men may shave face and neck.  Do not bring valuables to the hospital.    [facility] is not responsible for any belongings or valuables.               Contacts, dentures or bridgework may not be worn into surgery.  Leave your suitcase in the car. After surgery it may be brought to your room.  For patients admitted to the hospital, discharge time is determined by your treatment team.   Patients discharged the day of surgery will not be allowed to drive home.   Please read over the following fact sheets that you were given:     ____ Take these medicines the morning of surgery with A SIP OF WATER:    1. NONE  2.   3.   4.  5.  6.  ____ Fleet Enema (as directed)   ____ Use CHG Soap as directed  ____ Use inhalers on the day of surgery  ____ Stop metformin 2 days prior to surgery    ____ Take 1/2 of usual insulin dose the night before surgery and none on the morning of surgery.   ____ Stop Coumadin/Plavix/aspirin-N/A  ____ Stop Anti-inflammatories-NO NSAIDS OR ASA PRODUCTS  7 DAYS PRIOR-TYLENOL OK TO TAKE   ____ Stop supplements until after surgery.    ____ Bring C-Pap to the hospital.

## 2015-12-31 ENCOUNTER — Encounter: Payer: Self-pay | Admitting: Internal Medicine

## 2016-01-07 ENCOUNTER — Ambulatory Visit
Admission: RE | Admit: 2016-01-07 | Discharge: 2016-01-07 | Disposition: A | Discharge status: Home / Self Care | Payer: Managed Care, Other (non HMO) | Source: Ambulatory Visit | Attending: Orthopedic Surgery | Admitting: Orthopedic Surgery

## 2016-01-07 ENCOUNTER — Encounter
Admission: RE | Disposition: A | Discharge status: Home / Self Care | Payer: Self-pay | Source: Ambulatory Visit | Attending: Orthopedic Surgery

## 2016-01-07 ENCOUNTER — Ambulatory Visit: Payer: Managed Care, Other (non HMO) | Admitting: Anesthesiology

## 2016-01-07 ENCOUNTER — Encounter: Payer: Self-pay | Admitting: *Deleted

## 2016-01-07 DIAGNOSIS — M6752 Plica syndrome, left knee: Secondary | ICD-10-CM | POA: Insufficient documentation

## 2016-01-07 DIAGNOSIS — Z79899 Other long term (current) drug therapy: Secondary | ICD-10-CM | POA: Insufficient documentation

## 2016-01-07 DIAGNOSIS — Z833 Family history of diabetes mellitus: Secondary | ICD-10-CM | POA: Insufficient documentation

## 2016-01-07 DIAGNOSIS — Z886 Allergy status to analgesic agent status: Secondary | ICD-10-CM | POA: Diagnosis not present

## 2016-01-07 DIAGNOSIS — Z8249 Family history of ischemic heart disease and other diseases of the circulatory system: Secondary | ICD-10-CM | POA: Insufficient documentation

## 2016-01-07 DIAGNOSIS — Z885 Allergy status to narcotic agent status: Secondary | ICD-10-CM | POA: Insufficient documentation

## 2016-01-07 DIAGNOSIS — S83102A Unspecified subluxation of left knee, initial encounter: Secondary | ICD-10-CM | POA: Diagnosis present

## 2016-01-07 DIAGNOSIS — M059 Rheumatoid arthritis with rheumatoid factor, unspecified: Secondary | ICD-10-CM | POA: Diagnosis not present

## 2016-01-07 DIAGNOSIS — X58XXXA Exposure to other specified factors, initial encounter: Secondary | ICD-10-CM | POA: Insufficient documentation

## 2016-01-07 DIAGNOSIS — Z9889 Other specified postprocedural states: Secondary | ICD-10-CM | POA: Diagnosis not present

## 2016-01-07 DIAGNOSIS — G629 Polyneuropathy, unspecified: Secondary | ICD-10-CM | POA: Insufficient documentation

## 2016-01-07 DIAGNOSIS — S83282A Other tear of lateral meniscus, current injury, left knee, initial encounter: Secondary | ICD-10-CM | POA: Insufficient documentation

## 2016-01-07 DIAGNOSIS — Z8262 Family history of osteoporosis: Secondary | ICD-10-CM | POA: Diagnosis not present

## 2016-01-07 DIAGNOSIS — Z791 Long term (current) use of non-steroidal anti-inflammatories (NSAID): Secondary | ICD-10-CM | POA: Insufficient documentation

## 2016-01-07 DIAGNOSIS — E039 Hypothyroidism, unspecified: Secondary | ICD-10-CM | POA: Diagnosis not present

## 2016-01-07 HISTORY — PX: KNEE ARTHROSCOPY: SHX127

## 2016-01-07 SURGERY — ARTHROSCOPY, KNEE
Anesthesia: General | Site: Knee | Laterality: Left | Wound class: Clean

## 2016-01-07 MED ORDER — LACTATED RINGERS IV SOLN
INTRAVENOUS | Status: DC
  Administered 2016-01-07: 11:00:00 via INTRAVENOUS

## 2016-01-07 MED ORDER — FAMOTIDINE 20 MG PO TABS
ORAL_TABLET | ORAL | Status: AC
  Administered 2016-01-07: 20 mg via ORAL
  Filled 2016-01-07: qty 1

## 2016-01-07 MED ORDER — MIDAZOLAM HCL 2 MG/2ML IJ SOLN
INTRAMUSCULAR | Status: DC | PRN
  Administered 2016-01-07: 1 mg via INTRAVENOUS

## 2016-01-07 MED ORDER — BUPIVACAINE-EPINEPHRINE (PF) 0.5% -1:200000 IJ SOLN
INTRAMUSCULAR | Status: DC | PRN
  Administered 2016-01-07: 30 mL via PERINEURAL

## 2016-01-07 MED ORDER — BUPIVACAINE-EPINEPHRINE (PF) 0.5% -1:200000 IJ SOLN
INTRAMUSCULAR | Status: AC
  Filled 2016-01-07: qty 30

## 2016-01-07 MED ORDER — GLYCOPYRROLATE 0.2 MG/ML IJ SOLN
INTRAMUSCULAR | Status: DC | PRN
  Administered 2016-01-07: 0.2 mg via INTRAVENOUS

## 2016-01-07 MED ORDER — ONDANSETRON HCL 4 MG/2ML IJ SOLN
INTRAMUSCULAR | Status: DC | PRN
  Administered 2016-01-07: 4 mg via INTRAVENOUS

## 2016-01-07 MED ORDER — FENTANYL CITRATE (PF) 100 MCG/2ML IJ SOLN
INTRAMUSCULAR | Status: DC | PRN
  Administered 2016-01-07: 25 ug via INTRAVENOUS
  Administered 2016-01-07: 50 ug via INTRAVENOUS
  Administered 2016-01-07: 25 ug via INTRAVENOUS

## 2016-01-07 MED ORDER — ONDANSETRON HCL 4 MG/2ML IJ SOLN: 4.0000 mg | Freq: Once | INTRAMUSCULAR | Status: DC | PRN

## 2016-01-07 MED ORDER — FENTANYL CITRATE (PF) 100 MCG/2ML IJ SOLN
25.0000 ug | INTRAMUSCULAR | Status: DC | PRN
  Administered 2016-01-07 (×2): 25 ug via INTRAVENOUS

## 2016-01-07 MED ORDER — FENTANYL CITRATE (PF) 100 MCG/2ML IJ SOLN
INTRAMUSCULAR | Status: AC
  Filled 2016-01-07: qty 2

## 2016-01-07 MED ORDER — ACETAMINOPHEN 10 MG/ML IV SOLN
INTRAVENOUS | Status: DC | PRN
  Administered 2016-01-07: 1000 mg via INTRAVENOUS

## 2016-01-07 MED ORDER — FAMOTIDINE 20 MG PO TABS
20.0000 mg | ORAL_TABLET | Freq: Once | ORAL | Status: AC
  Administered 2016-01-07: 20 mg via ORAL

## 2016-01-07 MED ORDER — LIDOCAINE HCL (CARDIAC) 20 MG/ML IV SOLN
INTRAVENOUS | Status: DC | PRN
  Administered 2016-01-07: 100 mg via INTRAVENOUS

## 2016-01-07 MED ORDER — PROPOFOL 10 MG/ML IV BOLUS
INTRAVENOUS | Status: DC | PRN
  Administered 2016-01-07: 70 mg via INTRAVENOUS
  Administered 2016-01-07: 130 mg via INTRAVENOUS

## 2016-01-07 MED ORDER — ONDANSETRON 4 MG PO TBDP: 4.0000 mg | ORAL_TABLET | Freq: Three times a day (TID) | ORAL | Status: DC | PRN

## 2016-01-07 MED ORDER — HYDROCODONE-ACETAMINOPHEN 5-325 MG PO TABS: 1.0000 | ORAL_TABLET | ORAL | Status: DC | PRN

## 2016-01-07 MED ORDER — ACETAMINOPHEN 10 MG/ML IV SOLN
INTRAVENOUS | Status: AC
  Filled 2016-01-07: qty 100

## 2016-01-07 SURGICAL SUPPLY — 29 items
BANDAGE ACE 4X5 VEL STRL LF (GAUZE/BANDAGES/DRESSINGS) ×3 IMPLANT
BANDAGE ELASTIC 4 LF NS (GAUZE/BANDAGES/DRESSINGS) ×3 IMPLANT
BLADE FULL RADIUS 3.5 (BLADE) IMPLANT
BLADE INCISOR PLUS 4.5 (BLADE) ×3 IMPLANT
BLADE SHAVER 4.5 DBL SERAT CV (CUTTER) IMPLANT
BLADE SHAVER 4.5X7 STR FR (MISCELLANEOUS) IMPLANT
BNDG CMPR MED 5X4 ELC HKLP NS (GAUZE/BANDAGES/DRESSINGS) ×1
CHLORAPREP W/TINT 26ML (MISCELLANEOUS) ×3 IMPLANT
CUTTER AGGRESSIVE+ 3.5 (CUTTER) ×3 IMPLANT
GAUZE PETRO XEROFOAM 1X8 (MISCELLANEOUS) ×3 IMPLANT
GAUZE SPONGE 4X4 12PLY STRL (GAUZE/BANDAGES/DRESSINGS) ×3 IMPLANT
GLOVE BIOGEL PI IND STRL 9 (GLOVE) ×1 IMPLANT
GLOVE BIOGEL PI INDICATOR 9 (GLOVE) ×2
GLOVE SURG ORTHO 9.0 STRL STRW (GLOVE) ×3 IMPLANT
GOWN SPECIALTY ULTRA XL (MISCELLANEOUS) ×3 IMPLANT
GOWN STRL REUS W/ TWL LRG LVL3 (GOWN DISPOSABLE) ×1 IMPLANT
GOWN STRL REUS W/TWL LRG LVL3 (GOWN DISPOSABLE) ×3
IV LACTATED RINGER IRRG 3000ML (IV SOLUTION) ×12
IV LR IRRIG 3000ML ARTHROMATIC (IV SOLUTION) ×4 IMPLANT
KIT RM TURNOVER STRD PROC AR (KITS) ×3 IMPLANT
MANIFOLD NEPTUNE II (INSTRUMENTS) ×3 IMPLANT
PACK ARTHROSCOPY KNEE (MISCELLANEOUS) ×3 IMPLANT
SET TUBE SUCT SHAVER OUTFL 24K (TUBING) ×3 IMPLANT
SET TUBE TIP INTRA-ARTICULAR (MISCELLANEOUS) ×3 IMPLANT
SUT ETHILON 4-0 (SUTURE) ×3
SUT ETHILON 4-0 FS2 18XMFL BLK (SUTURE) ×1
SUTURE ETHLN 4-0 FS2 18XMF BLK (SUTURE) ×1 IMPLANT
TUBING ARTHRO INFLOW-ONLY STRL (TUBING) ×3 IMPLANT
WAND HAND CNTRL MULTIVAC 50 (MISCELLANEOUS) ×3 IMPLANT

## 2016-01-07 NOTE — Discharge Instructions (Addendum)
Weightbearing as tolerated on left leg. One aspirin a day until walking normally. Leave dressing in place if possible, if dressing slides down leg remove entire bandage covered to incisions with Band-Aids and put Ace wrap back on   AMBULATORY SURGERY  DISCHARGE INSTRUCTIONS   1) The drugs that you were given will stay in your system until tomorrow so for the next 24 hours you should not:  A) Drive an automobile B) Make any legal decisions C) Drink any alcoholic beverage  2) You may resume regular meals tomorrow.  Today it is better to start with liquids and gradually work up to solid foods.  You may eat anything you prefer, but it is better to start with liquids, then soup and crackers, and gradually work up to solid foods.  3) Please notify your doctor immediately if you have any unusual bleeding, trouble breathing, redness and pain at the surgery site, drainage, fever, or pain not relieved by medication.  4) Additional Instructions: stool softener is recommended to prevent constipation (Docusate/Colace)  Please contact your physician with any problems or Same Day Surgery at [phone], Monday through Friday 6 am to 4 pm, or [place] at [facility] number at [phone].

## 2016-01-07 NOTE — Anesthesia Preprocedure Evaluation (Signed)
Anesthesia Evaluation  Patient identified by MRN, date of birth, ID band Patient awake    Reviewed: Allergy & Precautions, NPO status , Patient's Chart, lab work & pertinent test results  History of Anesthesia Complications Negative for: history of anesthetic complications  Airway Mallampati: II       Dental  (+) Teeth Intact   Pulmonary neg pulmonary ROS,           Cardiovascular hypertension, Pt. on medications      Neuro/Psych negative neurological ROS     GI/Hepatic negative GI ROS, Neg liver ROS,   Endo/Other  negative endocrine ROS  Renal/GU negative Renal ROS     Musculoskeletal  (+) Arthritis , Rheumatoid disorders,    Abdominal   Peds  Hematology negative hematology ROS (+)   Anesthesia Other Findings   Reproductive/Obstetrics                             Anesthesia Physical Anesthesia Plan  ASA: II  Anesthesia Plan: General   Post-op Pain Management:    Induction: Intravenous  Airway Management Planned: LMA  Additional Equipment:   Intra-op Plan:   Post-operative Plan:   Informed Consent: I have reviewed the patients History and Physical, chart, labs and discussed the procedure including the risks, benefits and alternatives for the proposed anesthesia with the patient or authorized representative who has indicated his/her understanding and acceptance.     Plan Discussed with:   Anesthesia Plan Comments:         Anesthesia Quick Evaluation

## 2016-01-07 NOTE — Op Note (Signed)
01/07/2016  12:43 PM  PATIENT:  Brooke Tucker  65 y.o. female  PRE-OPERATIVE DIAGNOSIS:  LATERAL SUBLUXATION OF LEFT KNEE  POST-OPERATIVE DIAGNOSIS:  Left knee subluxation, plica, lateral meniscal tear  PROCEDURE:  Procedure(s): ARTHROSCOPY KNEE, lateral release, partial lateral menisectomy, excision plica (Left)  SURGEON: Leitha Schuller, MD  ASSISTANTS: None  ANESTHESIA:   general  EBL:  Total I/O In: 500 [I.V.:500] Out: 5 [Blood:5]  BLOOD ADMINISTERED:none  DRAINS: none   LOCAL MEDICATIONS USED:  MARCAINE     SPECIMEN:  No Specimen  DISPOSITION OF SPECIMEN:  N/A  COUNTS:  YES  TOURNIQUET:    IMPLNONE   DICTATION: .Dragon Dictation patient brought the operating room and after adequate general anesthesia was obtained, left leg was placed endoscopic leg holder. After prepping draping the sterile fashion and appropriate patient identification and timeout procedures completed, an inferolateral portal was made. On initial inspection the patella was noted to track laterally with a tight lateral retinaculum is moderate synovitis in the suprapatellar pouch coming around medially there is a thick plica band that impinged on the patellofemoral joint community medial joint an inferior medial portal was made and on probing meniscus was intact with mild to moderate degenerative changes throughout the medial compartment. Centrally anterior cruciate ligament noted to be intact. Going laterally there was a small loose body less than a centimeter in size anteromedially stuck to the synovium and this was removed. There is also a complex tear of the lateral meniscus involving most the anterior third some small portions more posteriorly more like degenerative flap tears. The partial meniscectomy was carried out with the ArthroCare wand. Next the lateral releases carried out and the patella tracked better. Collene Mares is then used to debride the plica and tissue impinging at the patellofemoral joint  anteriorly. After adequate resection of this the knee was irrigated until clear and all argentation withdrawn. Is were closed with simple interrupted 4-0 nylon and the knee infiltrated with total 30 cc half percent Sensorcaine with epinephrine. wound was then dressed with Xeroform 4 x 4's web roll and Ace PLAN OF CARE: Discharge to home after PACU  PATIENT DISPOSITION:  PACU - hemodynamically stable.

## 2016-01-07 NOTE — Transfer of Care (Signed)
Immediate Anesthesia Transfer of Care Note  Patient: Brooke Tucker  Procedure(s) Performed: Procedure(s): ARTHROSCOPY KNEE, lateral release, partial lateral menisectomy, excision plica (Left)  Patient Location: PACU  Anesthesia Type:General  Level of Consciousness: awake, alert , oriented and patient cooperative  Airway & Oxygen Therapy: Patient Spontanous Breathing and Patient connected to nasal cannula oxygen  Post-op Assessment: Report given to RN and Post -op Vital signs reviewed and stable  Post vital signs: Reviewed and stable  Last Vitals:  Filed Vitals:   01/07/16 1040 01/07/16 1253  BP: 156/80 113/56  Pulse: 80   Temp: 36.9 C 36.7 C  Resp: 16 14    Complications: No apparent anesthesia complications

## 2016-01-07 NOTE — H&P (Signed)
Reviewed paper H+P, will be scanned into chart. No changes noted.  

## 2016-01-07 NOTE — Anesthesia Postprocedure Evaluation (Signed)
Anesthesia Post Note  Patient: Brooke Tucker  Procedure(s) Performed: Procedure(s) (LRB): ARTHROSCOPY KNEE, lateral release, partial lateral menisectomy, excision plica (Left)  Patient location during evaluation: PACU Anesthesia Type: General Level of consciousness: awake and alert Pain management: pain level controlled Vital Signs Assessment: post-procedure vital signs reviewed and stable Respiratory status: spontaneous breathing and respiratory function stable Cardiovascular status: stable Anesthetic complications: no    Last Vitals:  Filed Vitals:   01/07/16 1428 01/07/16 1444  BP: 123/60 122/58  Pulse: 63 63  Temp:    Resp: 16 16    Last Pain:  Filed Vitals:   01/07/16 1445  PainSc: 0-No pain                 Draylon Mercadel K

## 2016-01-07 NOTE — Anesthesia Procedure Notes (Signed)
Procedure Name: LMA Insertion Date/Time: 01/07/2016 11:56 AM Performed by: Shirlee Limerick, Azlyn Wingler Pre-anesthesia Checklist: Patient identified, Emergency Drugs available, Suction available and Patient being monitored Patient Re-evaluated:Patient Re-evaluated prior to inductionOxygen Delivery Method: Circle system utilized Preoxygenation: Pre-oxygenation with 100% oxygen Intubation Type: IV induction LMA: LMA inserted LMA Size: 4.0 Dental Injury: Teeth and Oropharynx as per pre-operative assessment

## 2016-01-23 ENCOUNTER — Telehealth: Payer: Self-pay | Admitting: Internal Medicine

## 2016-01-23 NOTE — Telephone Encounter (Signed)
Pt called wanting to get lab work done. Need orders please and thank you! Call @ [phone].

## 2016-01-23 NOTE — Telephone Encounter (Signed)
Ok. I notified pt.

## 2016-01-23 NOTE — Telephone Encounter (Signed)
Please advise patient doesn't need labs.  thanks

## 2016-01-23 NOTE — Telephone Encounter (Signed)
Please advise if patient needs labs and order accordingly.  Last OV was 09/27/2015, last labs were 12/17/2015

## 2016-01-23 NOTE — Telephone Encounter (Signed)
No labs needed

## 2016-04-01 ENCOUNTER — Other Ambulatory Visit (HOSPITAL_COMMUNITY)
Admission: RE | Admit: 2016-04-01 | Discharge: 2016-04-01 | Disposition: A | Discharge status: Home / Self Care | Payer: Managed Care, Other (non HMO) | Source: Ambulatory Visit | Attending: Internal Medicine | Admitting: Internal Medicine

## 2016-04-01 ENCOUNTER — Ambulatory Visit (INDEPENDENT_AMBULATORY_CARE_PROVIDER_SITE_OTHER): Payer: Managed Care, Other (non HMO) | Admitting: Internal Medicine

## 2016-04-01 ENCOUNTER — Encounter: Payer: Self-pay | Admitting: Internal Medicine

## 2016-04-01 VITALS — BP 128/70 | HR 79 | Temp 98.2°F | Resp 12 | Ht 61.0 in | Wt 170.5 lb

## 2016-04-01 DIAGNOSIS — Z124 Encounter for screening for malignant neoplasm of cervix: Secondary | ICD-10-CM | POA: Diagnosis not present

## 2016-04-01 DIAGNOSIS — E042 Nontoxic multinodular goiter: Secondary | ICD-10-CM | POA: Insufficient documentation

## 2016-04-01 DIAGNOSIS — Z1151 Encounter for screening for human papillomavirus (HPV): Secondary | ICD-10-CM | POA: Diagnosis present

## 2016-04-01 DIAGNOSIS — E669 Obesity, unspecified: Secondary | ICD-10-CM

## 2016-04-01 DIAGNOSIS — Z01419 Encounter for gynecological examination (general) (routine) without abnormal findings: Secondary | ICD-10-CM | POA: Diagnosis present

## 2016-04-01 DIAGNOSIS — Z0000 Encounter for general adult medical examination without abnormal findings: Secondary | ICD-10-CM

## 2016-04-01 NOTE — Progress Notes (Signed)
Patient ID: Brooke Tucker, female    DOB: 1951-08-22  Age: 65 y.o. MRN: [id number]  The patient is here for annual  wellness examination and management of other chronic and acute problems.  Left knee arthroscopy, followed by steroid injection  Right knee now problematic .  planning on returning to ortho [facility]  Weight gain in Nov Dec due to pain. . surgery was in Jan Has goiter on exam    mng by ultrasound in 2008 up take  Done no biopsy   The risk factors are reflected in the social history.  The roster of all physicians providing medical care to patient - is listed in the Snapshot section of the chart.  Activities of daily living:  The patient is 100% independent in all ADLs: dressing, toileting, feeding as well as independent mobility  Home safety : The patient has smoke detectors in the home. They wear seatbelts.  There are no firearms at home. There is no violence in the home.   There is no risks for hepatitis, STDs or HIV. There is no   history of blood transfusion. They have no travel history to infectious disease endemic areas of the world.  The patient has seen their dentist in the last six month. They have seen their eye doctor in the last year. They admit to slight hearing difficulty with regard to whispered voices and some television programs.  They have deferred audiologic testing in the last year.  They do not  have excessive sun exposure. Discussed the need for sun protection: hats, long sleeves and use of sunscreen if there is significant sun exposure.   Diet: the importance of a healthy diet is discussed. They do have a healthy diet.  The benefits of regular aerobic exercise were discussed. She walks 4 times per week ,  20 minutes.   Depression screen: there are no signs or vegative symptoms of depression- irritability, change in appetite, anhedonia, sadness/tearfullness.  Cognitive assessment: the patient manages all their financial and personal affairs and is  actively engaged. They could relate day,date,year and events; recalled 2/3 objects at 3 minutes; performed clock-face test normally.  The following portions of the patient's history were reviewed and updated as appropriate: allergies, current medications, past family history, past medical history,  past surgical history, past social history  and problem list.  Visual acuity was not assessed per patient preference since she has regular follow up with her ophthalmologist. Hearing and body mass index were assessed and reviewed.   During the course of the visit the patient was educated and counseled about appropriate screening and preventive services including : fall prevention , diabetes screening, nutrition counseling, colorectal cancer screening, and recommended immunizations.    CC: The primary encounter diagnosis was Multinodular goiter. Diagnoses of Cervical cancer screening, Encounter for preventive health examination, and Obesity (BMI 30-39.9) were also pertinent to this visit.  History Melondy has a past medical history of Hypertension; Ovarian cyst (July 2008); and Rheumatoid arthritis(714.0).   She has past surgical history that includes Esophagogastroduodenoscopy (July 2008); Combined hysteroscopy diagnostic / D&C (July 2008); Excision Morton's neuroma; Metatarsal osteotomy (dec 2012); and Knee arthroscopy (Left, 01/07/2016).   Her family history includes Cancer in her maternal aunt and maternal grandmother; Diabetes in her father; Heart disease in her father.She reports that she has never smoked. She has never used smokeless tobacco. She reports that she drinks alcohol. She reports that she does not use illicit drugs.  Outpatient Prescriptions Prior to Visit  Medication Sig Dispense Refill  . Calcium Carbonate-Vitamin D (CALCIUM + D) 600-200 MG-UNIT TABS Take 1 tablet by mouth as needed. Reported on 01/07/2016    . methotrexate (50 MG/ML) 1 G injection Inject 1 g into the vein once a week.  THURSDAYS    . HYDROcodone-acetaminophen (NORCO) 5-325 MG tablet Take 1 tablet by mouth every 4 (four) hours as needed for moderate pain. (Patient not taking: Reported on 04/01/2016) 40 tablet 0  . ondansetron (ZOFRAN ODT) 4 MG disintegrating tablet Take 1 tablet (4 mg total) by mouth every 8 (eight) hours as needed for nausea or vomiting. (Patient not taking: Reported on 04/01/2016) 20 tablet 0   No facility-administered medications prior to visit.    Review of Systems   Patient denies headache, fevers, malaise, unintentional weight loss, skin rash, eye pain, sinus congestion and sinus pain, sore throat, dysphagia,  hemoptysis , cough, dyspnea, wheezing, chest pain, palpitations, orthopnea, edema, abdominal pain, nausea, melena, diarrhea, constipation, flank pain, dysuria, hematuria, urinary  Frequency, nocturia, numbness, tingling, seizures,  Focal weakness, Loss of consciousness,  Tremor, insomnia, depression, anxiety, and suicidal ideation.      Objective:  BP 128/70 mmHg  Pulse 79  Temp(Src) 98.2 F (36.8 C) (Oral)  Resp 12  Ht 5\' 1"  (1.549 m)  Wt 170 lb 8 oz (77.338 kg)  BMI 32.23 kg/m2  SpO2 97%  Physical Exam   General Appearance:    Alert, cooperative, no distress, appears stated age  Head:    Normocephalic, without obvious abnormality, atraumatic  Eyes:    PERRL, conjunctiva/corneas clear, EOM's intact, fundi    benign, both eyes  Ears:    Normal TM's and external ear canals, both ears  Nose:   Nares normal, septum midline, mucosa normal, no drainage    or sinus tenderness  Throat:   Lips, mucosa, and tongue normal; teeth and gums normal  Neck:   Supple, symmetrical, trachea midline, no adenopathy;    sMall goiter noted. no carotid bruits   bruit or JVD  Back:     Symmetric, no curvature, ROM normal, no CVA tenderness  Lungs:     Clear to auscultation bilaterally, respirations unlabored  Chest Wall:    No tenderness or deformity   Heart:    Regular rate and rhythm, S1  and S2 normal, no murmur, rub   or gallop  Breast Exam:    No tenderness, masses, or nipple abnormality  Abdomen:     Soft, non-tender, bowel sounds active all four quadrants,    no masses, no organomegaly  Genitalia:    Pelvic: cervix normal in appearance, external genitalia normal, no adnexal masses or tenderness, no cervical motion tenderness, rectovaginal septum normal, uterus normal size, shape, and consistency and vagina normal without discharge  Extremities:   Extremities normal, atraumatic, no cyanosis or edema  Pulses:   2+ and symmetric all extremities  Skin:   Skin color, texture, turgor normal, no rashes or lesions  Lymph nodes:   Cervical, supraclavicular, and axillary nodes normal  Neurologic:   CNII-XII intact, normal strength, sensation and reflexes    throughout      Assessment & Plan:   Problem List Items Addressed This Visit    Obesity (BMI 30-39.9)    I have addressed  BMI and recommended wt loss of 10% of body weigh over the next 6 months using a low glycemic index diet and regular exercise a minimum of 5 days per week.  Encounter for preventive health examination    Annual comprehensive preventive exam was done as well as an evaluation and management of chronic conditions .  During the course of the visit the patient was educated and counseled about appropriate screening and preventive services including :  diabetes screening, lipid analysis with projected  10 year  risk for CAD , nutrition counseling, breast, cervical and colorectal cancer screening, and recommended immunizations.  Printed recommendations for health maintenance screenings was give      Multinodular goiter - Primary    Thyroid normal.  Last [place] was 2008. Ultrasound ordered       Relevant Orders   US Soft Tissue Head/Neck    Other Visit Diagnoses    Cervical cancer screening        Relevant Orders    Cytology - PAP (Completed)       I have discontinued Ms. Cleverly's  HYDROcodone-acetaminophen and ondansetron. I am also having her maintain her Calcium Carbonate-Vitamin D and methotrexate.  No orders of the defined types were placed in this encounter.    Medications Discontinued During This Encounter  Medication Reason  . ondansetron (ZOFRAN ODT) 4 MG disintegrating tablet Completed Course  . HYDROcodone-acetaminophen (NORCO) 5-325 MG tablet Completed Course    Follow-up: Return in about 1 year (around 04/01/2017).   Sherlene Shams, MD

## 2016-04-01 NOTE — Progress Notes (Signed)
Pre-visit discussion using our clinic review tool. No additional management support is needed unless otherwise documented below in the visit note.  

## 2016-04-01 NOTE — Patient Instructions (Signed)
Mammogram and thyroid ultrasound are due  DEXA scan  will be due around ov 2017 to follow up on osteopenia  I  recommend getting the majority of your calcium and Vitamin D  through diet rather than supplements given the recent association of calcium supplements with increased coronary artery calcium scores  Try the almond , soy and cashew milks that most grocery stores  now carry  in the dairy  Section>   They are lactose free:  [employer] brand tastes the best   Menopause is a normal process in which your reproductive ability comes to an end. This process happens gradually over a span of months to years, usually between the ages of 15 and 22. Menopause is complete when you have missed 12 consecutive menstrual periods. It is important to talk with your health care provider about some of the most common conditions that affect postmenopausal women, such as heart disease, cancer, and bone loss (osteoporosis). Adopting a healthy lifestyle and getting preventive care can help to promote your health and wellness. Those actions can also lower your chances of developing some of these common conditions. WHAT SHOULD I KNOW ABOUT MENOPAUSE? During menopause, you may experience a number of symptoms, such as:  Moderate-to-severe hot flashes.  Night sweats.  Decrease in sex drive.  Mood swings.  Headaches.  Tiredness.  Irritability.  Memory problems.  Insomnia. Choosing to treat or not to treat menopausal changes is an individual decision that you make with your health care provider. WHAT SHOULD I KNOW ABOUT HORMONE REPLACEMENT THERAPY AND SUPPLEMENTS? Hormone therapy products are effective for treating symptoms that are associated with menopause, such as hot flashes and night sweats. Hormone replacement carries certain risks, especially as you become older. If you are thinking about using estrogen or estrogen with progestin treatments, discuss the benefits and risks with your health care  provider. WHAT SHOULD I KNOW ABOUT HEART DISEASE AND STROKE? Heart disease, heart attack, and stroke become more likely as you age. This may be due, in part, to the hormonal changes that your body experiences during menopause. These can affect how your body processes dietary fats, triglycerides, and cholesterol. Heart attack and stroke are both medical emergencies. There are many things that you can do to help prevent heart disease and stroke:  Have your blood pressure checked at least every 1-2 years. High blood pressure causes heart disease and increases the risk of stroke.  If you are 30-37 years old, ask your health care provider if you should take aspirin to prevent a heart attack or a stroke.  Do not use any tobacco products, including cigarettes, chewing tobacco, or electronic cigarettes. If you need help quitting, ask your health care provider.  It is important to eat a healthy diet and maintain a healthy weight.  Be sure to include plenty of vegetables, fruits, low-fat dairy products, and lean protein.  Avoid eating foods that are high in solid fats, added sugars, or salt (sodium).  Get regular exercise. This is one of the most important things that you can do for your health.  Try to exercise for at least 150 minutes each week. The type of exercise that you do should increase your heart rate and make you sweat. This is known as moderate-intensity exercise.  Try to do strengthening exercises at least twice each week. Do these in addition to the moderate-intensity exercise.  Know your numbers.Ask your health care provider to check your cholesterol and your blood glucose. Continue to have your blood  tested as directed by your health care provider. WHAT SHOULD I KNOW ABOUT CANCER SCREENING? There are several types of cancer. Take the following steps to reduce your risk and to catch any cancer development as early as possible. Breast Cancer  Practice breast self-awareness.  This  means understanding how your breasts normally appear and feel.  It also means doing regular breast self-exams. Let your health care provider know about any changes, no matter how small.  If you are 76 or older, have a clinician do a breast exam (clinical breast exam or CBE) every year. Depending on your age, family history, and medical history, it may be recommended that you also have a yearly breast X-ray (mammogram).  If you have a family history of breast cancer, talk with your health care provider about genetic screening.  If you are at high risk for breast cancer, talk with your health care provider about having an MRI and a mammogram every year.  Breast cancer (BRCA) gene test is recommended for women who have family members with BRCA-related cancers. Results of the assessment will determine the need for genetic counseling and BRCA1 and for BRCA2 testing. BRCA-related cancers include these types:  Breast. This occurs in males or females.  Ovarian.  Tubal. This may also be called fallopian tube cancer.  Cancer of the abdominal or pelvic lining (peritoneal cancer).  Prostate.  Pancreatic. Cervical, Uterine, and Ovarian Cancer Your health care provider may recommend that you be screened regularly for cancer of the pelvic organs. These include your ovaries, uterus, and vagina. This screening involves a pelvic exam, which includes checking for microscopic changes to the surface of your cervix (Pap test).  For women ages 21-65, health care providers may recommend a pelvic exam and a Pap test every three years. For women ages 16-65, they may recommend the Pap test and pelvic exam, combined with testing for human papilloma virus (HPV), every five years. Some types of HPV increase your risk of cervical cancer. Testing for HPV may also be done on women of any age who have unclear Pap test results.  Other health care providers may not recommend any screening for nonpregnant women who are  considered low risk for pelvic cancer and have no symptoms. Ask your health care provider if a screening pelvic exam is right for you.  If you have had past treatment for cervical cancer or a condition that could lead to cancer, you need Pap tests and screening for cancer for at least 20 years after your treatment. If Pap tests have been discontinued for you, your risk factors (such as having a new sexual partner) need to be reassessed to determine if you should start having screenings again. Some women have medical problems that increase the chance of getting cervical cancer. In these cases, your health care provider may recommend that you have screening and Pap tests more often.  If you have a family history of uterine cancer or ovarian cancer, talk with your health care provider about genetic screening.  If you have vaginal bleeding after reaching menopause, tell your health care provider.  There are currently no reliable tests available to screen for ovarian cancer. Lung Cancer Lung cancer screening is recommended for adults 35-22 years old who are at high risk for lung cancer because of a history of smoking. A yearly low-dose CT scan of the lungs is recommended if you:  Currently smoke.  Have a history of at least 30 pack-years of smoking and you currently smoke  or have quit within the past 15 years. A pack-year is smoking an average of one pack of cigarettes per day for one year. Yearly screening should:  Continue until it has been 15 years since you quit.  Stop if you develop a health problem that would prevent you from having lung cancer treatment. Colorectal Cancer  This type of cancer can be detected and can often be prevented.  Routine colorectal cancer screening usually begins at age 40 and continues through age 37.  If you have risk factors for colon cancer, your health care provider may recommend that you be screened at an earlier age.  If you have a family history of  colorectal cancer, talk with your health care provider about genetic screening.  Your health care provider may also recommend using home test kits to check for hidden blood in your stool.  A small camera at the end of a tube can be used to examine your colon directly (sigmoidoscopy or colonoscopy). This is done to check for the earliest forms of colorectal cancer.  Direct examination of the colon should be repeated every 5-10 years until age 69. However, if early forms of precancerous polyps or small growths are found or if you have a family history or genetic risk for colorectal cancer, you may need to be screened more often. Skin Cancer  Check your skin from head to toe regularly.  Monitor any moles. Be sure to tell your health care provider:  About any new moles or changes in moles, especially if there is a change in a mole's shape or color.  If you have a mole that is larger than the size of a pencil eraser.  If any of your family members has a history of skin cancer, especially at a young age, talk with your health care provider about genetic screening.  Always use sunscreen. Apply sunscreen liberally and repeatedly throughout the day.  Whenever you are outside, protect yourself by wearing long sleeves, pants, a wide-brimmed hat, and sunglasses. WHAT SHOULD I KNOW ABOUT OSTEOPOROSIS? Osteoporosis is a condition in which bone destruction happens more quickly than new bone creation. After menopause, you may be at an increased risk for osteoporosis. To help prevent osteoporosis or the bone fractures that can happen because of osteoporosis, the following is recommended:  If you are 40-39 years old, get at least 1,000 mg of calcium and at least 600 mg of vitamin D per day.  If you are older than age 47 but younger than age 7, get at least 1,200 mg of calcium and at least 600 mg of vitamin D per day.  If you are older than age 83, get at least 1,200 mg of calcium and at least 800 mg of  vitamin D per day. Smoking and excessive alcohol intake increase the risk of osteoporosis. Eat foods that are rich in calcium and vitamin D, and do weight-bearing exercises several times each week as directed by your health care provider. WHAT SHOULD I KNOW ABOUT HOW MENOPAUSE AFFECTS MY MENTAL HEALTH? Depression may occur at any age, but it is more common as you become older. Common symptoms of depression include:  Low or sad mood.  Changes in sleep patterns.  Changes in appetite or eating patterns.  Feeling an overall lack of motivation or enjoyment of activities that you previously enjoyed.  Frequent crying spells. Talk with your health care provider if you think that you are experiencing depression. WHAT SHOULD I KNOW ABOUT IMMUNIZATIONS? It is important  that you get and maintain your immunizations. These include:  Tetanus, diphtheria, and pertussis (Tdap) booster vaccine.  Influenza every year before the flu season begins.  Pneumonia vaccine.  Shingles vaccine. Your health care provider may also recommend other immunizations.   This information is not intended to replace advice given to you by your health care provider. Make sure you discuss any questions you have with your health care provider.   Document Released: 02/05/2006 Document Revised: 01/04/2015 Document Reviewed: 08/16/2014 Elsevier Interactive Patient Education [employer].

## 2016-04-02 ENCOUNTER — Encounter: Payer: Self-pay | Admitting: Internal Medicine

## 2016-04-02 LAB — CYTOLOGY - PAP

## 2016-04-04 NOTE — Assessment & Plan Note (Signed)
I have addressed  BMI and recommended wt loss of 10% of body weigh over the next 6 months using a low glycemic index diet and regular exercise a minimum of 5 days per week.   

## 2016-04-04 NOTE — Assessment & Plan Note (Signed)
Annual comprehensive preventive exam was done as well as an evaluation and management of chronic conditions .  During the course of the visit the patient was educated and counseled about appropriate screening and preventive services including :  diabetes screening, lipid analysis with projected  10 year  risk for CAD , nutrition counseling, breast, cervical and colorectal cancer screening, and recommended immunizations.  Printed recommendations for health maintenance screenings was give 

## 2016-04-04 NOTE — Assessment & Plan Note (Signed)
Thyroid normal.  Last [place] was 2008. Ultrasound ordered

## 2016-04-13 ENCOUNTER — Ambulatory Visit: Payer: Managed Care, Other (non HMO)

## 2016-08-28 ENCOUNTER — Telehealth: Payer: Self-pay | Admitting: *Deleted

## 2016-08-28 ENCOUNTER — Other Ambulatory Visit: Payer: Self-pay | Admitting: Internal Medicine

## 2016-08-28 DIAGNOSIS — E559 Vitamin D deficiency, unspecified: Secondary | ICD-10-CM

## 2016-08-28 DIAGNOSIS — R5383 Other fatigue: Secondary | ICD-10-CM

## 2016-08-28 NOTE — Telephone Encounter (Signed)
Patient requested to have a vitamin D lab ordered. Patient stated that she has RA and low energy levels.

## 2016-08-28 NOTE — Telephone Encounter (Signed)
Please advise 

## 2016-08-28 NOTE — Telephone Encounter (Signed)
Labs ordered.

## 2016-09-04 ENCOUNTER — Other Ambulatory Visit (INDEPENDENT_AMBULATORY_CARE_PROVIDER_SITE_OTHER): Payer: Managed Care, Other (non HMO)

## 2016-09-04 DIAGNOSIS — R5383 Other fatigue: Secondary | ICD-10-CM | POA: Diagnosis not present

## 2016-09-04 DIAGNOSIS — E559 Vitamin D deficiency, unspecified: Secondary | ICD-10-CM

## 2016-09-04 LAB — CBC WITH DIFFERENTIAL/PLATELET
Basophils Absolute: 0 10*3/uL (ref 0.0–0.1)
Basophils Relative: 0.2 % (ref 0.0–3.0)
EOS ABS: 0 10*3/uL (ref 0.0–0.7)
Eosinophils Relative: 0.1 % (ref 0.0–5.0)
HCT: 39 % (ref 36.0–46.0)
Hemoglobin: 13.1 g/dL (ref 12.0–15.0)
Lymphocytes Relative: 6.2 % — ABNORMAL LOW (ref 12.0–46.0)
Lymphs Abs: 0.5 10*3/uL — ABNORMAL LOW (ref 0.7–4.0)
MCHC: 33.6 g/dL (ref 30.0–36.0)
MCV: 90.5 fl (ref 78.0–100.0)
MONO ABS: 0 10*3/uL — AB (ref 0.1–1.0)
Monocytes Relative: 0.6 % — ABNORMAL LOW (ref 3.0–12.0)
Neutro Abs: 7 10*3/uL (ref 1.4–7.7)
Neutrophils Relative %: 92.9 % — ABNORMAL HIGH (ref 43.0–77.0)
Platelets: 253 10*3/uL (ref 150.0–400.0)
RBC: 4.31 Mil/uL (ref 3.87–5.11)
RDW: 14.6 % (ref 11.5–15.5)
WBC: 7.6 10*3/uL (ref 4.0–10.5)

## 2016-09-04 LAB — COMPREHENSIVE METABOLIC PANEL
ALBUMIN: 4 g/dL (ref 3.5–5.2)
ALT: 9 U/L (ref 0–35)
AST: 15 U/L (ref 0–37)
Alkaline Phosphatase: 92 U/L (ref 39–117)
BILIRUBIN TOTAL: 0.3 mg/dL (ref 0.2–1.2)
BUN: 11 mg/dL (ref 6–23)
CHLORIDE: 102 meq/L (ref 96–112)
CO2: 28 mEq/L (ref 19–32)
CREATININE: 0.63 mg/dL (ref 0.40–1.20)
Calcium: 9 mg/dL (ref 8.4–10.5)
GFR: 100.79 mL/min (ref >60.00)
Glucose, Bld: 132 mg/dL — ABNORMAL HIGH (ref 70–99)
Potassium: 4.7 mEq/L (ref 3.5–5.1)
SODIUM: 138 meq/L (ref 135–145)
Total Protein: 7.8 g/dL (ref 6.0–8.3)

## 2016-09-04 LAB — IBC PANEL
IRON: 40 ug/dL — AB (ref 42–145)
Saturation Ratios: 10.5 % — ABNORMAL LOW (ref 20.0–50.0)
Transferrin: 272 mg/dL (ref 212.0–360.0)

## 2016-09-04 LAB — VITAMIN B12: VITAMIN B 12: 407 pg/mL (ref 211–911)

## 2016-09-04 LAB — VITAMIN D 25 HYDROXY (VIT D DEFICIENCY, FRACTURES): VITD: 27.72 ng/mL — ABNORMAL LOW (ref 30.00–100.00)

## 2016-09-06 ENCOUNTER — Encounter: Payer: Self-pay | Admitting: Internal Medicine

## 2016-11-12 ENCOUNTER — Telehealth (INDEPENDENT_AMBULATORY_CARE_PROVIDER_SITE_OTHER): Payer: Self-pay | Admitting: *Deleted

## 2016-11-12 NOTE — Telephone Encounter (Signed)
Pt. Called stating she wanted a call back ASAP [phone]. Pt would not tell me what this regarding

## 2016-11-13 NOTE — Telephone Encounter (Signed)
This message was documented on the wrong patient.  Documented on correct person.

## 2017-05-10 ENCOUNTER — Encounter: Payer: Self-pay | Admitting: Internal Medicine

## 2017-05-14 ENCOUNTER — Encounter: Payer: Managed Care, Other (non HMO) | Admitting: Internal Medicine

## 2017-09-23 NOTE — Telephone Encounter (Signed)
Orders

## 2017-11-08 ENCOUNTER — Telehealth: Payer: Self-pay

## 2017-11-08 DIAGNOSIS — E785 Hyperlipidemia, unspecified: Secondary | ICD-10-CM

## 2017-11-08 DIAGNOSIS — E559 Vitamin D deficiency, unspecified: Secondary | ICD-10-CM

## 2017-11-08 DIAGNOSIS — R5383 Other fatigue: Secondary | ICD-10-CM

## 2017-11-08 NOTE — Telephone Encounter (Signed)
Pt is wanting labs drawn before her physical in February. I have ordered CMP, CBC, Lipid, TSH, Vitamin D. Is there anything that I missed?

## 2018-01-31 ENCOUNTER — Other Ambulatory Visit: Payer: Self-pay | Admitting: Orthopedic Surgery

## 2018-01-31 DIAGNOSIS — M1712 Unilateral primary osteoarthritis, left knee: Secondary | ICD-10-CM

## 2018-02-03 ENCOUNTER — Ambulatory Visit
Admission: RE | Admit: 2018-02-03 | Discharge: 2018-02-03 | Disposition: A | Discharge status: Home / Self Care | Payer: Medicare Other | Source: Ambulatory Visit | Attending: Cardiology | Admitting: Cardiology

## 2018-02-03 ENCOUNTER — Encounter
Admission: RE | Disposition: A | Discharge status: Home / Self Care | Payer: Self-pay | Source: Ambulatory Visit | Attending: Cardiology

## 2018-02-03 DIAGNOSIS — M1990 Unspecified osteoarthritis, unspecified site: Secondary | ICD-10-CM | POA: Diagnosis not present

## 2018-02-03 DIAGNOSIS — R079 Chest pain, unspecified: Secondary | ICD-10-CM | POA: Diagnosis present

## 2018-02-03 DIAGNOSIS — E039 Hypothyroidism, unspecified: Secondary | ICD-10-CM | POA: Insufficient documentation

## 2018-02-03 DIAGNOSIS — M069 Rheumatoid arthritis, unspecified: Secondary | ICD-10-CM | POA: Diagnosis not present

## 2018-02-03 DIAGNOSIS — Z8249 Family history of ischemic heart disease and other diseases of the circulatory system: Secondary | ICD-10-CM | POA: Insufficient documentation

## 2018-02-03 HISTORY — PX: LEFT HEART CATH AND CORONARY ANGIOGRAPHY: CATH118249

## 2018-02-03 SURGERY — LEFT HEART CATH AND CORONARY ANGIOGRAPHY
Anesthesia: Moderate Sedation | Laterality: Left

## 2018-02-03 MED ORDER — SODIUM CHLORIDE 0.9 % IV SOLN: 250.0000 mL | INTRAVENOUS | Status: DC | PRN

## 2018-02-03 MED ORDER — FENTANYL CITRATE (PF) 100 MCG/2ML IJ SOLN
INTRAMUSCULAR | Status: DC | PRN
  Administered 2018-02-03: 25 ug via INTRAVENOUS

## 2018-02-03 MED ORDER — SODIUM CHLORIDE 0.9% FLUSH: 3.0000 mL | Freq: Two times a day (BID) | INTRAVENOUS | Status: DC

## 2018-02-03 MED ORDER — ACETAMINOPHEN 325 MG PO TABS: 650.0000 mg | ORAL_TABLET | ORAL | Status: DC | PRN

## 2018-02-03 MED ORDER — HEPARIN (PORCINE) IN NACL 2-0.9 UNIT/ML-% IJ SOLN
INTRAMUSCULAR | Status: AC
  Filled 2018-02-03: qty 1000

## 2018-02-03 MED ORDER — MIDAZOLAM HCL 2 MG/2ML IJ SOLN
INTRAMUSCULAR | Status: AC
  Filled 2018-02-03: qty 2

## 2018-02-03 MED ORDER — ASPIRIN 81 MG PO CHEW
CHEWABLE_TABLET | ORAL | Status: AC
  Filled 2018-02-03: qty 1

## 2018-02-03 MED ORDER — SODIUM CHLORIDE 0.9 % WEIGHT BASED INFUSION
1.0000 mL/kg/h | INTRAVENOUS | Status: DC
  Administered 2018-02-03: 1 mL/kg/h via INTRAVENOUS

## 2018-02-03 MED ORDER — IOPAMIDOL (ISOVUE-300) INJECTION 61%
INTRAVENOUS | Status: DC | PRN
  Administered 2018-02-03: 90 mL via INTRA_ARTERIAL

## 2018-02-03 MED ORDER — SODIUM CHLORIDE 0.9% FLUSH: 3.0000 mL | INTRAVENOUS | Status: DC | PRN

## 2018-02-03 MED ORDER — MIDAZOLAM HCL 2 MG/2ML IJ SOLN
INTRAMUSCULAR | Status: DC | PRN
  Administered 2018-02-03: 1 mg via INTRAVENOUS

## 2018-02-03 MED ORDER — ASPIRIN 81 MG PO CHEW
81.0000 mg | CHEWABLE_TABLET | ORAL | Status: AC
  Administered 2018-02-03: 81 mg via ORAL

## 2018-02-03 MED ORDER — ONDANSETRON HCL 4 MG/2ML IJ SOLN: 4.0000 mg | Freq: Four times a day (QID) | INTRAMUSCULAR | Status: DC | PRN

## 2018-02-03 MED ORDER — SODIUM CHLORIDE 0.9 % IV SOLN
INTRAVENOUS | Status: DC
  Administered 2018-02-03: 08:00:00 via INTRAVENOUS

## 2018-02-03 MED ORDER — FENTANYL CITRATE (PF) 100 MCG/2ML IJ SOLN
INTRAMUSCULAR | Status: AC
  Filled 2018-02-03: qty 2

## 2018-02-03 SURGICAL SUPPLY — 11 items
CATH INFINITI 5FR ANG PIGTAIL (CATHETERS) ×3 IMPLANT
CATH INFINITI 5FR JL4 (CATHETERS) ×3 IMPLANT
CATH INFINITI JR4 5F (CATHETERS) ×2 IMPLANT
DEVICE CLOSURE MYNXGRIP 5F (Vascular Products) ×3 IMPLANT
KIT MANI 3VAL PERCEP (MISCELLANEOUS) ×3 IMPLANT
NEEDLE PERC 18GX7CM (NEEDLE) ×3 IMPLANT
PACK CARDIAC CATH (CUSTOM PROCEDURE TRAY) ×3 IMPLANT
SHEATH AVANTI 5FR X 11CM (SHEATH) ×3 IMPLANT
WIRE EMERALD 3MM-J .035X260CM (WIRE) ×2 IMPLANT
WIRE EMERALD ST .035X150CM (WIRE) ×3 IMPLANT
WIRE GUIDERIGHT .035X150 (WIRE) ×4 IMPLANT

## 2018-02-04 ENCOUNTER — Encounter: Payer: Self-pay | Admitting: Cardiology

## 2018-02-04 ENCOUNTER — Other Ambulatory Visit: Payer: Managed Care, Other (non HMO)

## 2018-02-11 ENCOUNTER — Ambulatory Visit (INDEPENDENT_AMBULATORY_CARE_PROVIDER_SITE_OTHER): Payer: Medicare Other | Admitting: Internal Medicine

## 2018-02-11 ENCOUNTER — Encounter: Payer: Self-pay | Admitting: Internal Medicine

## 2018-02-11 VITALS — BP 134/70 | HR 87 | Temp 97.9°F | Resp 15 | Ht 62.0 in | Wt 170.0 lb

## 2018-02-11 DIAGNOSIS — M810 Age-related osteoporosis without current pathological fracture: Secondary | ICD-10-CM

## 2018-02-11 DIAGNOSIS — E559 Vitamin D deficiency, unspecified: Secondary | ICD-10-CM | POA: Diagnosis not present

## 2018-02-11 DIAGNOSIS — E785 Hyperlipidemia, unspecified: Secondary | ICD-10-CM

## 2018-02-11 DIAGNOSIS — E042 Nontoxic multinodular goiter: Secondary | ICD-10-CM

## 2018-02-11 DIAGNOSIS — Z23 Encounter for immunization: Secondary | ICD-10-CM

## 2018-02-11 DIAGNOSIS — R5383 Other fatigue: Secondary | ICD-10-CM

## 2018-02-11 DIAGNOSIS — E0590 Thyrotoxicosis, unspecified without thyrotoxic crisis or storm: Secondary | ICD-10-CM

## 2018-02-11 DIAGNOSIS — Z1231 Encounter for screening mammogram for malignant neoplasm of breast: Secondary | ICD-10-CM | POA: Diagnosis not present

## 2018-02-11 DIAGNOSIS — M069 Rheumatoid arthritis, unspecified: Secondary | ICD-10-CM

## 2018-02-11 DIAGNOSIS — Z1239 Encounter for other screening for malignant neoplasm of breast: Secondary | ICD-10-CM

## 2018-02-11 DIAGNOSIS — E2839 Other primary ovarian failure: Secondary | ICD-10-CM | POA: Diagnosis not present

## 2018-02-11 LAB — LIPID PANEL
CHOL/HDL RATIO: 2
Cholesterol: 159 mg/dL (ref 0–200)
HDL: 68.4 mg/dL (ref >39.00)
LDL CALC: 74 mg/dL (ref 0–99)
NonHDL: 90.16
Triglycerides: 82 mg/dL (ref 0.0–149.0)
VLDL: 16.4 mg/dL (ref 0.0–40.0)

## 2018-02-11 LAB — TSH: TSH: <0.01 u[IU]/mL — ABNORMAL LOW (ref 0.35–4.50)

## 2018-02-11 LAB — VITAMIN D 25 HYDROXY (VIT D DEFICIENCY, FRACTURES): VITD: 28.96 ng/mL — ABNORMAL LOW (ref 30.00–100.00)

## 2018-02-11 MED ORDER — ZOSTER VAC RECOMB ADJUVANTED 50 MCG/0.5ML IM SUSR: 0.5000 mL | Freq: Once | INTRAMUSCULAR | 1 refills | Status: AC

## 2018-02-11 NOTE — Progress Notes (Addendum)
Patient ID: Brooke Tucker, female    DOB: [birth year]  Age: 67 y.o. MRN: [id number]  The patient is here for follow up and  management of other chronic and acute problems. Patient  was last seen  In April 2017 and has been lost to follow up  .  Last mammogam 2015 Last colonoscopy 2008.  NORMAL RISK,  COLOGUARD DISCUSSED AND REQUESTED    The risk factors are reflected in the social history.  The roster of all physicians providing medical care to patient - is listed in the Snapshot section of the chart.  Activities of daily living:  The patient is 100% independent in all ADLs: dressing, toileting, feeding as well as independent mobility  Home safety : The patient has smoke detectors in the home. They wear seatbelts.  There are no firearms at home. There is no violence in the home.   There is no risks for hepatitis, STDs or HIV. There is no   history of blood transfusion. They have no travel history to infectious disease endemic areas of the world.  The patient has seen their dentist in the last six month. They have seen their eye doctor in the last year. They admit to slight hearing difficulty with regard to whispered voices and some television programs.  They have deferred audiologic testing in the last year.  They do not  have excessive sun exposure. Discussed the need for sun protection: hats, long sleeves and use of sunscreen if there is significant sun exposure.   Diet: the importance of a healthy diet is discussed. They do have a healthy diet.  The benefits of regular aerobic exercise were discussed. She  Has not ben exercising due to exercise intolerance (see below cc) .   Depression screen: there are no signs or vegative symptoms of depression- irritability, change in appetite, anhedonia, sadness/tearfullness.  Cognitive assessment: the patient manages all their financial and personal affairs and is actively engaged. They could relate day,date,year and events; recalled 2/3 objects at 3  minutes; performed clock-face test normally.  The following portions of the patient's history were reviewed and updated as appropriate: allergies, current medications, past family history, past medical history,  past surgical history, past social history  and problem list.  Visual acuity was not assessed per patient preference since she has regular follow up with her ophthalmologist. Hearing and body mass index were assessed and reviewed.   During the course of the visit the patient was educated and counseled about appropriate screening and preventive services including : fall prevention , diabetes screening, nutrition counseling, colorectal cancer screening, and recommended immunizations.    CC: The primary encounter diagnosis was Fatigue, unspecified type. Diagnoses of Vitamin D deficiency, Hyperlipidemia LDL goal <130, Breast cancer screening, Estrogen deficiency, Need for 23-polyvalent pneumococcal polysaccharide vaccine, Age-related osteoporosis without current pathological fracture, Multinodular goiter, Hyperthyroidism, and Rheumatoid arthritis, involving unspecified site, unspecified rheumatoid factor presence (HCC) were also pertinent to this visit.   1) Palpitations:  Underwent Cardiac cath last Thursday by [facility] due to abnormal LeXICAN during workup for palpitations and exertional dyspnea.  Myoview suggested DIFFUSE ISCHEMIA AND  EF of  45%  CATH was NORMAL.  Still short of breath due to tachycardia .  Metoprolol dose was increased to 50 mg once daily yesterday ;  Has not had thyroid function tested in 2 years!   2) Persistent Left Knee  Pain:  Has been present for many years,  Has limited her ability to engage in regular exercise  has decided to have a total knee replacement which  is scheduled for  next month  BY Rosita Kea.   [name] has a past medical history of Hypertension, Multinodular goiter, Ovarian cyst (July 2008), and Rheumatoid arthritis(714.0).   She has a past surgical  history that includes Esophagogastroduodenoscopy (July 2008); Combined hysteroscopy diagnostic / D&C (July 2008); Excision Morton's neuroma; Metatarsal osteotomy (dec 2012); Knee arthroscopy (Left, 01/07/2016); and LEFT HEART CATH AND CORONARY ANGIOGRAPHY (Left, 02/03/2018).   Her family history includes Cancer in her maternal aunt and maternal grandmother; Diabetes in her father; Heart disease in her father.She reports that  has never smoked. she has never used smokeless tobacco. She reports that she drinks alcohol. She reports that she does not use drugs.  Outpatient Medications Prior to Visit  Medication Sig Dispense Refill  . diclofenac sodium (VOLTAREN) 1 % GEL Apply 1 application topically 4 (four) times daily as needed (joint pain).    Marland Kitchen diphenhydramine-acetaminophen (TYLENOL PM) 25-500 MG TABS tablet Take 1 tablet by mouth at bedtime.    . metoprolol succinate (TOPROL-XL) 25 MG 24 hr tablet Take 50 mg by mouth daily.      No facility-administered medications prior to visit.     Review of Systems   Patient denies headache, fevers, malaise, unintentional weight loss, skin rash, eye pain, sinus congestion and sinus pain, sore throat, dysphagia,  hemoptysis , cough, dyspnea, wheezing, chest pain, , orthopnea, edema, abdominal pain, nausea, melena, diarrhea, constipation, flank pain, dysuria, hematuria, urinary  Frequency, nocturia, numbness, tingling, seizures,  Focal weakness, Loss of consciousness,  Tremor, insomnia, depression, anxiety, and suicidal ideation.       Objective:  BP 134/70 (BP Location: Left Arm, Patient Position: Sitting, Cuff Size: Normal)   Pulse 87   Temp 97.9 F (36.6 C) (Oral)   Resp 15   Ht 5\' 2"  (1.575 m)   Wt 170 lb (77.1 kg)   SpO2 98%   BMI 31.09 kg/m   Physical Exam   General appearance: alert, cooperative and appears stated age Head: Normocephalic, without obvious abnormality, atraumatic Eyes: conjunctivae/corneas clear. PERRL, EOM's intact. Fundi  benign. Ears: normal TM's and external ear canals both ears Nose: Nares normal. Septum midline. Mucosa normal. No drainage or sinus tenderness. Throat: lips, mucosa, and tongue normal; teeth and gums normal Neck: no adenopathy, no carotid bruit, no JVD, supple, symmetrical, trachea midline,  Thyroid enlarged diffusely  Lungs: clear to auscultation bilaterally Breasts: normal appearance, no masses or tenderness Heart: regular rate and rhythm, S1, S2 normal, no murmur, click, rub or gallop Abdomen: soft, non-tender; bowel sounds normal; no masses,  no organomegaly Extremities: extremities normal, atraumatic, no cyanosis or edema Pulses: 2+ and symmetric Skin: Skin color, texture, turgor normal. No rashes or lesions Neurologic: Alert and oriented X 3, normal strength and tone. Normal symmetric reflexes. Normal coordination and gait.     Assessment & Plan:   Problem List Items Addressed This Visit    Rheumatoid arthritis (HCC)    Managed by Dr. with injectable MTX.        Osteoporosis     T scores about the same -2.4 is the worst,  After  Suspending  boniva  Continue calcium, bit D and repeat DEXA is due.        Vitamin D deficiency   Relevant Orders   VITAMIN D 25 Hydroxy (Vit-D Deficiency, Fractures) (Completed)   Multinodular goiter    Thyroid has not been checked since last visit in 2016  ans is now  overactive .  She has a history of nontoxic MNG . Additional thyroid studies (T4, T3,  Thyroid receptor ab,  And RI uptake study ordered along with Referral to Endocrinology.  Continue metoprolol.   Lab Results  Component Value Date   TSH <0.01 Repeated and verified X2. (L) 02/11/2018         Relevant Orders   Ambulatory referral to Endocrinology    Other Visit Diagnoses    Fatigue, unspecified type    -  Primary   Relevant Orders   TSH (Completed)   Hyperlipidemia LDL goal <130       Relevant Orders   Lipid panel (Completed)   Breast cancer screening        Relevant Orders   MM SCREENING BREAST TOMO BILATERAL   Estrogen deficiency       Relevant Orders   DG Bone Density   Need for 23-polyvalent pneumococcal polysaccharide vaccine       Relevant Orders   Pneumococcal polysaccharide vaccine 23-valent greater than or equal to 2yo subcutaneous/IM (Completed)   Hyperthyroidism       Relevant Orders   Ambulatory referral to Endocrinology    A total of 40 minutes was spent with patient more than half of which was spent in counseling patient on the above mentioned issues , reviewing and explaining recent labs and imaging studies done, and coordination of care.   I am having Brooke Tucker start on Zoster Vaccine Adjuvanted. I am also having her maintain her diclofenac sodium, metoprolol succinate, and diphenhydramine-acetaminophen.  Meds ordered this encounter  Medications  . Zoster Vaccine Adjuvanted [facility]) injection    Sig: Inject 0.5 mLs into the muscle once for 1 dose.    Dispense:  1 each    Refill:  1    There are no discontinued medications.  Follow-up: Return in about 1 year (around 02/11/2019).   Sherlene Shams, MD

## 2018-02-11 NOTE — Patient Instructions (Addendum)
Your received your final Pneumonia vaccine today   The ShingRx vaccine is NOT A LIVE VIRUS VACCINE SO YOU CAN HAVE IT.  It is now available in local pharmacies and is much more protective thant Zostavax,  It is therefore ADVISED for all interested adults over 50 to prevent shingles  Mammogram ordered  Cologuard ordered    Health Maintenance for Postmenopausal Women Menopause is a normal process in which your reproductive ability comes to an end. This process happens gradually over a span of months to years, usually between the ages of 78 and 38. Menopause is complete when you have missed 12 consecutive menstrual periods. It is important to talk with your health care provider about some of the most common conditions that affect postmenopausal women, such as heart disease, cancer, and bone loss (osteoporosis). Adopting a healthy lifestyle and getting preventive care can help to promote your health and wellness. Those actions can also lower your chances of developing some of these common conditions. What should I know about menopause? During menopause, you may experience a number of symptoms, such as:  Moderate-to-severe hot flashes.  Night sweats.  Decrease in sex drive.  Mood swings.  Headaches.  Tiredness.  Irritability.  Memory problems.  Insomnia.  Choosing to treat or not to treat menopausal changes is an individual decision that you make with your health care provider. What should I know about hormone replacement therapy and supplements? Hormone therapy products are effective for treating symptoms that are associated with menopause, such as hot flashes and night sweats. Hormone replacement carries certain risks, especially as you become older. If you are thinking about using estrogen or estrogen with progestin treatments, discuss the benefits and risks with your health care provider. What should I know about heart disease and stroke? Heart disease, heart attack, and stroke  become more likely as you age. This may be due, in part, to the hormonal changes that your body experiences during menopause. These can affect how your body processes dietary fats, triglycerides, and cholesterol. Heart attack and stroke are both medical emergencies. There are many things that you can do to help prevent heart disease and stroke:  Have your blood pressure checked at least every 1-2 years. High blood pressure causes heart disease and increases the risk of stroke.  If you are 71-13 years old, ask your health care provider if you should take aspirin to prevent a heart attack or a stroke.  Do not use any tobacco products, including cigarettes, chewing tobacco, or electronic cigarettes. If you need help quitting, ask your health care provider.  It is important to eat a healthy diet and maintain a healthy weight. ? Be sure to include plenty of vegetables, fruits, low-fat dairy products, and lean protein. ? Avoid eating foods that are high in solid fats, added sugars, or salt (sodium).  Get regular exercise. This is one of the most important things that you can do for your health. ? Try to exercise for at least 150 minutes each week. The type of exercise that you do should increase your heart rate and make you sweat. This is known as moderate-intensity exercise. ? Try to do strengthening exercises at least twice each week. Do these in addition to the moderate-intensity exercise.  Know your numbers.Ask your health care provider to check your cholesterol and your blood glucose. Continue to have your blood tested as directed by your health care provider.  What should I know about cancer screening? There are several types of cancer.  Take the following steps to reduce your risk and to catch any cancer development as early as possible. Breast Cancer  Practice breast self-awareness. ? This means understanding how your breasts normally appear and feel. ? It also means doing regular breast  self-exams. Let your health care provider know about any changes, no matter how small.  If you are 69 or older, have a clinician do a breast exam (clinical breast exam or CBE) every year. Depending on your age, family history, and medical history, it may be recommended that you also have a yearly breast X-ray (mammogram).  If you have a family history of breast cancer, talk with your health care provider about genetic screening.  If you are at high risk for breast cancer, talk with your health care provider about having an MRI and a mammogram every year.  Breast cancer (BRCA) gene test is recommended for women who have family members with BRCA-related cancers. Results of the assessment will determine the need for genetic counseling and BRCA1 and for BRCA2 testing. BRCA-related cancers include these types: ? Breast. This occurs in males or females. ? Ovarian. ? Tubal. This may also be called fallopian tube cancer. ? Cancer of the abdominal or pelvic lining (peritoneal cancer). ? Prostate. ? Pancreatic.  Cervical, Uterine, and Ovarian Cancer Your health care provider may recommend that you be screened regularly for cancer of the pelvic organs. These include your ovaries, uterus, and vagina. This screening involves a pelvic exam, which includes checking for microscopic changes to the surface of your cervix (Pap test).  For women ages 21-65, health care providers may recommend a pelvic exam and a Pap test every three years. For women ages 80-65, they may recommend the Pap test and pelvic exam, combined with testing for human papilloma virus (HPV), every five years. Some types of HPV increase your risk of cervical cancer. Testing for HPV may also be done on women of any age who have unclear Pap test results.  Other health care providers may not recommend any screening for nonpregnant women who are considered low risk for pelvic cancer and have no symptoms. Ask your health care provider if a screening  pelvic exam is right for you.  If you have had past treatment for cervical cancer or a condition that could lead to cancer, you need Pap tests and screening for cancer for at least 20 years after your treatment. If Pap tests have been discontinued for you, your risk factors (such as having a new sexual partner) need to be reassessed to determine if you should start having screenings again. Some women have medical problems that increase the chance of getting cervical cancer. In these cases, your health care provider may recommend that you have screening and Pap tests more often.  If you have a family history of uterine cancer or ovarian cancer, talk with your health care provider about genetic screening.  If you have vaginal bleeding after reaching menopause, tell your health care provider.  There are currently no reliable tests available to screen for ovarian cancer.  Lung Cancer Lung cancer screening is recommended for adults 90-57 years old who are at high risk for lung cancer because of a history of smoking. A yearly low-dose CT scan of the lungs is recommended if you:  Currently smoke.  Have a history of at least 30 pack-years of smoking and you currently smoke or have quit within the past 15 years. A pack-year is smoking an average of one pack of cigarettes  per day for one year.  Yearly screening should:  Continue until it has been 15 years since you quit.  Stop if you develop a health problem that would prevent you from having lung cancer treatment.  Colorectal Cancer  This type of cancer can be detected and can often be prevented.  Routine colorectal cancer screening usually begins at age 57 and continues through age 27.  If you have risk factors for colon cancer, your health care provider may recommend that you be screened at an earlier age.  If you have a family history of colorectal cancer, talk with your health care provider about genetic screening.  Your health care  provider may also recommend using home test kits to check for hidden blood in your stool.  A small camera at the end of a tube can be used to examine your colon directly (sigmoidoscopy or colonoscopy). This is done to check for the earliest forms of colorectal cancer.  Direct examination of the colon should be repeated every 5-10 years until age 3. However, if early forms of precancerous polyps or small growths are found or if you have a family history or genetic risk for colorectal cancer, you may need to be screened more often.  Skin Cancer  Check your skin from head to toe regularly.  Monitor any moles. Be sure to tell your health care provider: ? About any new moles or changes in moles, especially if there is a change in a mole's shape or color. ? If you have a mole that is larger than the size of a pencil eraser.  If any of your family members has a history of skin cancer, especially at a young age, talk with your health care provider about genetic screening.  Always use sunscreen. Apply sunscreen liberally and repeatedly throughout the day.  Whenever you are outside, protect yourself by wearing long sleeves, pants, a wide-brimmed hat, and sunglasses.  What should I know about osteoporosis? Osteoporosis is a condition in which bone destruction happens more quickly than new bone creation. After menopause, you may be at an increased risk for osteoporosis. To help prevent osteoporosis or the bone fractures that can happen because of osteoporosis, the following is recommended:  If you are 48-50 years old, get at least 1,000 mg of calcium and at least 600 mg of vitamin D per day.  If you are older than age 23 but younger than age 40, get at least 1,200 mg of calcium and at least 600 mg of vitamin D per day.  If you are older than age 69, get at least 1,200 mg of calcium and at least 800 mg of vitamin D per day.  Smoking and excessive alcohol intake increase the risk of osteoporosis. Eat  foods that are rich in calcium and vitamin D, and do weight-bearing exercises several times each week as directed by your health care provider. What should I know about how menopause affects my mental health? Depression may occur at any age, but it is more common as you become older. Common symptoms of depression include:  Low or sad mood.  Changes in sleep patterns.  Changes in appetite or eating patterns.  Feeling an overall lack of motivation or enjoyment of activities that you previously enjoyed.  Frequent crying spells.  Talk with your health care provider if you think that you are experiencing depression. What should I know about immunizations? It is important that you get and maintain your immunizations. These include:  Tetanus, diphtheria, and  pertussis (Tdap) booster vaccine.  Influenza every year before the flu season begins.  Pneumonia vaccine.  Shingles vaccine.  Your health care provider may also recommend other immunizations. This information is not intended to replace advice given to you by your health care provider. Make sure you discuss any questions you have with your health care provider. Document Released: 02/05/2006 Document Revised: 07/03/2016 Document Reviewed: 09/17/2015 Elsevier Interactive Patient Education  2018 [employer].

## 2018-02-12 NOTE — Assessment & Plan Note (Signed)
Managed by Dr. Lavenia Atlas with injectable MTX.

## 2018-02-12 NOTE — Assessment & Plan Note (Signed)
T scores about the same -2.4 is the worst,  After  Suspending  boniva  Continue calcium, bit D and repeat DEXA is due.

## 2018-02-12 NOTE — Assessment & Plan Note (Addendum)
Thyroid has not been checked since last visit in 2016 ans is now  overactive .  She has a history of nontoxic MNG . Additional thyroid studies (T4, T3,  Thyroid receptor ab,  And RI uptake study ordered along with Referral to Endocrinology.  Continue metoprolol.   Lab Results  Component Value Date   TSH <0.01 Repeated and verified X2. (L) 02/11/2018

## 2018-02-13 ENCOUNTER — Telehealth: Payer: Self-pay | Admitting: Internal Medicine

## 2018-02-13 ENCOUNTER — Encounter: Payer: Self-pay | Admitting: Internal Medicine

## 2018-02-13 ENCOUNTER — Other Ambulatory Visit: Payer: Self-pay | Admitting: Internal Medicine

## 2018-02-13 DIAGNOSIS — E0590 Thyrotoxicosis, unspecified without thyrotoxic crisis or storm: Secondary | ICD-10-CM

## 2018-02-14 ENCOUNTER — Telehealth: Payer: Self-pay | Admitting: Internal Medicine

## 2018-02-14 ENCOUNTER — Other Ambulatory Visit: Payer: 59

## 2018-02-14 ENCOUNTER — Other Ambulatory Visit (INDEPENDENT_AMBULATORY_CARE_PROVIDER_SITE_OTHER): Payer: Medicare Other

## 2018-02-14 DIAGNOSIS — E0590 Thyrotoxicosis, unspecified without thyrotoxic crisis or storm: Secondary | ICD-10-CM | POA: Diagnosis not present

## 2018-02-14 DIAGNOSIS — E042 Nontoxic multinodular goiter: Secondary | ICD-10-CM

## 2018-02-14 LAB — T3, FREE: T3, Free: 5.5 pg/mL — ABNORMAL HIGH (ref 2.3–4.2)

## 2018-02-14 LAB — T4, FREE: FREE T4: 1.81 ng/dL — AB (ref 0.60–1.60)

## 2018-02-14 NOTE — Telephone Encounter (Signed)
Yes, [facility] hs 2 endocirnologists in [place].  Urgent referral made  Georgianne Fick disregard the previous endocrinology referral to [facility] becuase patient was told they wold not be able to see her untl April /May

## 2018-02-14 NOTE — Telephone Encounter (Signed)
Spoke with pt and she stated that she came in this morning to have her lab work drawn. Pt also wanted to let you know that she has had to reschedule her nuclear testing that she needed done because she can't have iodine six weeks prior to the test and she just had the heart cath done that used iodine. So the nuclear testing is being done on 03/18/2018. She also stated that since she couldn't have the nuclear testing done until then she has rescheduled her surgery. Pt stated that she called Dr. Pricilla Handler office to see if she could get an appt scheduled but they hadn't received her referral yet but they told her that it would be end of April or May before she could see either of the providers in that office, so pt is wanting to know if there is somewhere that you could refer her or if you could recommend something for her to do until she can get in to see Dr. Tedd Sias.

## 2018-02-14 NOTE — Telephone Encounter (Signed)
Copied from CRM [id number]. Topic: Quick Communication - See Telephone Encounter >> Feb 14, 2018 12:22 PM Rudi Coco, [place] wrote: CRM for notification. See Telephone encounter for:   02/14/18. Pt. Calling to check to see if she can get another referral sent to another provider for thyroid due to not being able to get in until may or June. Pt. Can be reached at [phone].

## 2018-02-15 ENCOUNTER — Ambulatory Visit
Admission: RE | Admit: 2018-02-15 | Discharge: 2018-02-15 | Disposition: A | Discharge status: Home / Self Care | Payer: Medicare Other | Source: Ambulatory Visit | Attending: Orthopedic Surgery | Admitting: Orthopedic Surgery

## 2018-02-15 DIAGNOSIS — M1712 Unilateral primary osteoarthritis, left knee: Secondary | ICD-10-CM | POA: Insufficient documentation

## 2018-02-15 LAB — THYROID PEROXIDASE ANTIBODY: Thyroperoxidase Ab SerPl-aCnc: 378 IU/mL — ABNORMAL HIGH (ref <9)

## 2018-02-15 NOTE — Telephone Encounter (Signed)
Spoke with pt and informed her that Dr. Darrick Huntsman has changed the referral to [facility] in [place]. The pt gave a verbal understanding.

## 2018-02-16 ENCOUNTER — Encounter: Payer: Self-pay | Admitting: Internal Medicine

## 2018-03-10 ENCOUNTER — Ambulatory Visit (INDEPENDENT_AMBULATORY_CARE_PROVIDER_SITE_OTHER): Payer: Medicare Other | Admitting: Endocrinology

## 2018-03-10 ENCOUNTER — Encounter: Payer: Self-pay | Admitting: Endocrinology

## 2018-03-10 VITALS — BP 128/80 | HR 96 | Wt 169.2 lb

## 2018-03-10 DIAGNOSIS — E042 Nontoxic multinodular goiter: Secondary | ICD-10-CM | POA: Diagnosis not present

## 2018-03-10 NOTE — Progress Notes (Signed)
Subjective:    Patient ID: Brooke Tucker, female    DOB: 08/21/1951, 67 y.o.   MRN: [id number]  HPI Pt is referred by Dr Darrick Huntsman, for hyperthyroidism.  Pt reports he was dx'ed with a goiter in 2008, and hyperthyroidism in early 2019.  she has never been on therapy for this.  she has never had XRT to the anterior neck, or thyroid surgery.  she does not consume kelp or any other non-prescribed thyroid medication.  she has never been on amiodarone.  She has moderate palpitations in the chest, and assoc fatigue.  Since on metoprolol, palpitations are less, but fatigue is worse.  Past Medical History:  Diagnosis Date  . Hypertension   . Multinodular goiter   . Ovarian cyst July 2008   laparscopic biopsy normal  . Rheumatoid arthritis(714.0)    managed by Beverley Fiedler with MTX    Past Surgical History:  Procedure Laterality Date  . COMBINED HYSTEROSCOPY DIAGNOSTIC / D&C  July 2008   [place]  . ESOPHAGOGASTRODUODENOSCOPY  July 2008   normal  . EXCISION MORTON'S NEUROMA     Left foot  . KNEE ARTHROSCOPY Left 01/07/2016   Procedure: ARTHROSCOPY KNEE, lateral release, partial lateral menisectomy, excision plica;  Surgeon: Kennedy Bucker, MD;  Location: [facility] ORS;  Service: Orthopedics;  Laterality: Left;  . LEFT HEART CATH AND CORONARY ANGIOGRAPHY Left 02/03/2018   Procedure: LEFT HEART CATH AND CORONARY ANGIOGRAPHY;  Surgeon: Marcina Millard, MD;  Location: [facility] INVASIVE CV LAB;  Service: Cardiovascular;  Laterality: Left;  . METATARSAL OSTEOTOMY  dec 2012   right foot, 5th MT  [name])    Social History   Socioeconomic History  . Marital status: Married    Spouse name: Not on file  . Number of children: Not on file  . Years of education: Not on file  . Highest education level: Not on file  Social Needs  . Financial resource strain: Not on file  . Food insecurity - worry: Not on file  . Food insecurity - inability: Not on file  . Transportation needs - medical: Not on file  .  Transportation needs - non-medical: Not on file  Occupational History  . Not on file  Tobacco Use  . Smoking status: Never Smoker  . Smokeless tobacco: Never Used  Substance and Sexual Activity  . Alcohol use: Yes    Comment: occasional  . Drug use: No  . Sexual activity: Not Currently  Other Topics Concern  . Not on file  Social History Narrative  . Not on file    Current Outpatient Medications on File Prior to Visit  Medication Sig Dispense Refill  . diclofenac sodium (VOLTAREN) 1 % GEL Apply 1 application topically 4 (four) times daily as needed (joint pain).    Marland Kitchen diphenhydramine-acetaminophen (TYLENOL PM) 25-500 MG TABS tablet Take 1 tablet by mouth at bedtime.    . metoprolol succinate (TOPROL-XL) 25 MG 24 hr tablet Take 50 mg by mouth daily.      No current facility-administered medications on file prior to visit.     Allergies  Allergen Reactions  . Naproxen Nausea And Vomiting  . Tramadol Nausea And Vomiting  . Vicodin [Hydrocodone-Acetaminophen] Nausea And Vomiting    Family History  Problem Relation Age of Onset  . Diabetes Father   . Heart disease Father   . Cancer Maternal Aunt        Breast, metastatic  . Cancer Maternal Grandmother  ovarian  . Thyroid disease Neg Hx     BP 128/80 (BP Location: Left Arm, Patient Position: Sitting, Cuff Size: Normal)   Pulse 96   Wt 169 lb 3.2 oz (76.7 kg)   SpO2 96%   BMI 30.95 kg/m     Review of Systems denies weight loss, hoarseness, visual loss, leg swelling, polyuria, anxiety, heat intolerance, easy bruising, and rhinorrhea. She has frequent BM's, DOE, muscle weakness, dry skin, tremor, and intermitt headache.       Objective:   Physical Exam VS: see vs page GEN: no distress HEAD: head: no deformity eyes: no periorbital swelling, no proptosis external nose and ears are normal mouth: no lesion seen NECK: several 2-3 cm thyroid nodules are easily palpable. CHEST WALL: no deformity LUNGS: clear to  auscultation CV: reg rate and rhythm, no murmur ABD: abdomen is soft, nontender.  no hepatosplenomegaly.  not distended.  no hernia MUSCULOSKELETAL: muscle bulk and strength are grossly normal.  no obvious joint swelling.  gait is steady with a cane EXTEMITIES: no deformity.  no edema PULSES: no carotid bruit NEURO:  cn 2-12 grossly intact.   readily moves all 4's.  sensation is intact to touch on all 4's.  No tremor SKIN:  Normal texture and temperature.  No rash or suspicious lesion is visible.  Not diaphoretic. NODES:  None palpable at the neck PSYCH: alert, well-oriented.  Does not appear anxious nor depressed.  [place] (2008): multinodular appearance and is quite heterogeneous in echotexture. The findings are compatible with multinodular goiter.   Lab Results  Component Value Date   TSH <0.01 Repeated and verified X2. (L) 02/11/2018   I have reviewed outside records, and summarized: Pt was noted to have hyperthyroidism, and referred here.  She was noted to have fatigue sxs and f/u multinodular goiter, so she was ref here.        Assessment & Plan:  Multinodular goiter, usually hereditary hyperthyroidism, due to the goiter.  new to me.  We discussed rx options.  She chooses [facility]  Patient Instructions  let's check a thyroid "scan" (a special, but easy and painless type of thyroid x ray).  It works like this: you go to the x-ray department of the hospital to swallow a pill, which contains a miniscule amount of radiation.  You will not notice any symptoms from this.  You will go back to the x-ray department the next day, to lie down in front of a camera.  The results of this will be sent to me.  X-ray might want you to do an ultrasound next.  If so, i'll order it for you next.  Based on the results, i hope to order for you a treatment pill of radioactive iodine.  Although it is a larger amount of radiation, you will again notice no symptoms from this.  The pill is gone from your body in a few  days (during which you should stay away from other people), but takes several months to work.  Therefore, please return here approximately 6-8 weeks after the treatment.  This treatment has been available for many years, and the only known side-effect is an underactive thyroid.  It is possible that i would eventually prescribe for you a thyroid hormone pill, which is very inexpensive.  You don't have to worry about side-effects of this thyroid hormone pill, because it is the same molecule your thyroid makes.  However, since you want to have the knee surgery soon, please come back 1  week after the iodine pill.  Then I can prescribe for you a pill to control the thyroid while the iodine pill is working.

## 2018-03-10 NOTE — Patient Instructions (Addendum)
let's check a thyroid "scan" (a special, but easy and painless type of thyroid x ray).  It works like this: you go to the x-ray department of the hospital to swallow a pill, which contains a miniscule amount of radiation.  You will not notice any symptoms from this.  You will go back to the x-ray department the next day, to lie down in front of a camera.  The results of this will be sent to me.  X-ray might want you to do an ultrasound next.  If so, i'll order it for you next.  Based on the results, i hope to order for you a treatment pill of radioactive iodine.  Although it is a larger amount of radiation, you will again notice no symptoms from this.  The pill is gone from your body in a few days (during which you should stay away from other people), but takes several months to work.  Therefore, please return here approximately 6-8 weeks after the treatment.  This treatment has been available for many years, and the only known side-effect is an underactive thyroid.  It is possible that i would eventually prescribe for you a thyroid hormone pill, which is very inexpensive.  You don't have to worry about side-effects of this thyroid hormone pill, because it is the same molecule your thyroid makes.  However, since you want to have the knee surgery soon, please come back 1 week after the iodine pill.  Then I can prescribe for you a pill to control the thyroid while the iodine pill is working.

## 2018-03-12 NOTE — Telephone Encounter (Signed)
I just sent a message to check the status of pt appt.

## 2018-03-14 NOTE — Telephone Encounter (Signed)
Spoke with pt and she stated that she saw Dr. Everardo All last week.

## 2018-03-17 ENCOUNTER — Ambulatory Visit
Admission: RE | Admit: 2018-03-17 | Discharge: 2018-03-17 | Disposition: A | Discharge status: Home / Self Care | Payer: Medicare Other | Source: Ambulatory Visit | Attending: Internal Medicine | Admitting: Internal Medicine

## 2018-03-17 DIAGNOSIS — E0590 Thyrotoxicosis, unspecified without thyrotoxic crisis or storm: Secondary | ICD-10-CM | POA: Diagnosis present

## 2018-03-17 DIAGNOSIS — E041 Nontoxic single thyroid nodule: Secondary | ICD-10-CM | POA: Diagnosis not present

## 2018-03-17 MED ORDER — SODIUM IODIDE I-123 7.4 MBQ CAPS
160.5720 | ORAL_CAPSULE | Freq: Once | ORAL | Status: AC
  Administered 2018-03-17: 160.572 via ORAL

## 2018-03-18 ENCOUNTER — Ambulatory Visit
Admission: RE | Admit: 2018-03-18 | Discharge: 2018-03-18 | Disposition: A | Discharge status: Home / Self Care | Payer: Medicare Other | Source: Ambulatory Visit | Attending: Internal Medicine | Admitting: Internal Medicine

## 2018-03-22 ENCOUNTER — Encounter: Payer: Self-pay | Admitting: Endocrinology

## 2018-03-25 ENCOUNTER — Other Ambulatory Visit: Payer: Self-pay | Admitting: Endocrinology

## 2018-03-25 ENCOUNTER — Telehealth: Payer: Self-pay | Admitting: Internal Medicine

## 2018-03-25 DIAGNOSIS — E042 Nontoxic multinodular goiter: Secondary | ICD-10-CM

## 2018-03-25 NOTE — Telephone Encounter (Signed)
Please advise 

## 2018-03-25 NOTE — Telephone Encounter (Signed)
Copied from CRM [id number]. Topic: Quick Communication - See Telephone Encounter >> Mar 25, 2018 11:23 AM Cipriano Bunker wrote: CRM for notification.   Pt. Is asking about test results and will need this sent to Dr. Everardo All, Endocrinologist.    She also has not heard from anyone on mammogram or bone density.   Please call pt.  See Telephone encounter for: 03/25/18.

## 2018-03-28 NOTE — Telephone Encounter (Signed)
Patient requesting results from ultrasound dated 03/18/18, I do not see where PCP has resulted?

## 2018-03-28 NOTE — Telephone Encounter (Signed)
Dr Everardo All orderd the [place] on March 29 after seeing the positive thyroid uptake scan  I do not have the results of the [place]

## 2018-03-30 NOTE — Telephone Encounter (Signed)
Test results sent. Patient aware.

## 2018-04-03 IMAGING — NM NM THYROID IMAGING W/ UPTAKE MULTI (4&24 HR)
1 series · 4 of 4 positions shown · non-contrast
Comparison: None.

CLINICAL DATA: Clinical hyperthyroidism.

EXAM:
THYROID SCAN AND UPTAKE - 4 AND 24 HOURS
TECHNIQUE: Following oral administration of Q-67B capsule, anterior planar
imaging was acquired at 24 hours. Thyroid uptake was calculated with
a thyroid probe at 4-6 hours and 24 hours.
RADIOPHARMACEUTICALS:  160.57 uCi Q-67B sodium iodide p.o.

[Series 1000: (id) thyroid scan · 2.40mm/px · 4 of 4 slices shown]
[im 1/4]
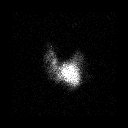
[im 2/4]
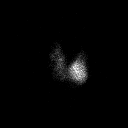
[im 3/4]
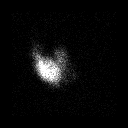
[im 4/4]
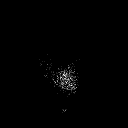

[4 of 4 positions shown; findings below may reference images not displayed]

FINDINGS: There is a large hot nodule involving the left thyroid lobe
suppressing the rest of the gland. The rest of the gland is very
heterogeneous and there could be small nodules. Correlation with
thyroid ultrasound examination may be helpful..

4 hour Q-67B uptake = 31.1% (normal 5-20%)

24 hour Q-67B uptake = 58.3% (normal 10-30%)
IMPRESSION: Large toxic hyperfunctioning (hot) left thyroid nodule suppressing
the rest of the gland. This is likely responsible for the patient's
hyperthyroidism and elevated I 123 uptake. Correlation with thyroid
ultrasound may be helpful.

## 2018-04-04 ENCOUNTER — Other Ambulatory Visit: Payer: Self-pay | Admitting: Endocrinology

## 2018-04-04 ENCOUNTER — Ambulatory Visit
Admission: RE | Admit: 2018-04-04 | Discharge: 2018-04-04 | Disposition: A | Discharge status: Home / Self Care | Payer: Medicare Other | Source: Ambulatory Visit | Attending: Endocrinology | Admitting: Endocrinology

## 2018-04-04 DIAGNOSIS — E042 Nontoxic multinodular goiter: Secondary | ICD-10-CM

## 2018-04-05 ENCOUNTER — Encounter: Payer: Self-pay | Admitting: Endocrinology

## 2018-04-06 ENCOUNTER — Telehealth: Payer: Self-pay | Admitting: Endocrinology

## 2018-04-06 NOTE — Telephone Encounter (Signed)
Patient stated she seen on her my chart they the radio active iodine pill had been order and sent in but she contacted the pharmacy and they stated they have no received anything from our office.      [id number] - [place], [place] - [address] AT [facility] OF [address] [facility]

## 2018-04-08 NOTE — Telephone Encounter (Signed)
Patient stated that she is waiting on Radiology to call back to schedule RAI treatment. We have clarified that pharmacy does not receive RAI pill.

## 2018-04-14 ENCOUNTER — Ambulatory Visit
Admission: RE | Admit: 2018-04-14 | Discharge: 2018-04-14 | Disposition: A | Discharge status: Home / Self Care | Payer: Medicare Other | Source: Ambulatory Visit | Attending: Endocrinology | Admitting: Endocrinology

## 2018-04-14 DIAGNOSIS — E042 Nontoxic multinodular goiter: Secondary | ICD-10-CM | POA: Diagnosis not present

## 2018-04-14 MED ORDER — SODIUM IODIDE I 131 CAPSULE
20.0650 | Freq: Once | INTRAVENOUS | Status: AC | PRN
  Administered 2018-04-14: 20.065 via ORAL

## 2018-04-26 ENCOUNTER — Ambulatory Visit (INDEPENDENT_AMBULATORY_CARE_PROVIDER_SITE_OTHER): Payer: Medicare Other | Admitting: Endocrinology

## 2018-04-26 ENCOUNTER — Encounter: Payer: Self-pay | Admitting: Endocrinology

## 2018-04-26 DIAGNOSIS — E0590 Thyrotoxicosis, unspecified without thyrotoxic crisis or storm: Secondary | ICD-10-CM | POA: Diagnosis not present

## 2018-04-26 LAB — T4, FREE: FREE T4: 1.23 ng/dL (ref 0.60–1.60)

## 2018-04-26 LAB — TSH: TSH: <0.01 u[IU]/mL — ABNORMAL LOW (ref 0.35–4.50)

## 2018-04-26 MED ORDER — METHIMAZOLE 10 MG PO TABS: 10.0000 mg | ORAL_TABLET | Freq: Two times a day (BID) | ORAL | 2 refills | Status: DC

## 2018-04-26 NOTE — Patient Instructions (Addendum)
blood tests are requested for you today.  We'll let you know about the results. Based on the results, I would prescribe for you a pill to take temporarily, while the radioactive iodine pill is working. If ever you have fever while taking methimazole, stop it and call us, even if the reason is obvious, because of the risk of a rare side-effect.    Please continue the same metoprolol.  Please come back for a follow-up appointment in 2-3 weeks.  Then I would hope to clear you for the surgery.

## 2018-04-26 NOTE — Progress Notes (Signed)
Subjective:    Patient ID: Brooke Tucker, female    DOB: 1951/10/07, 67 y.o.   MRN: [id number]  HPI Pt returns for f/u of hyperthyroidism (she was dx'ed with a multinodular goiter in 2008, and hyperthyroidism in early 2019; she had RAI on 04/14/18; she needs to be cleared for surgery soon).  pt states she feels well in general. Past Medical History:  Diagnosis Date  . Hypertension   . Multinodular goiter   . Ovarian cyst July 2008   laparscopic biopsy normal  . Rheumatoid arthritis(714.0)    managed by Beverley Fiedler with MTX    Past Surgical History:  Procedure Laterality Date  . COMBINED HYSTEROSCOPY DIAGNOSTIC / D&C  July 2008   [place]  . ESOPHAGOGASTRODUODENOSCOPY  July 2008   normal  . EXCISION MORTON'S NEUROMA     Left foot  . KNEE ARTHROSCOPY Left 01/07/2016   Procedure: ARTHROSCOPY KNEE, lateral release, partial lateral menisectomy, excision plica;  Surgeon: Kennedy Bucker, MD;  Location: [facility] ORS;  Service: Orthopedics;  Laterality: Left;  . LEFT HEART CATH AND CORONARY ANGIOGRAPHY Left 02/03/2018   Procedure: LEFT HEART CATH AND CORONARY ANGIOGRAPHY;  Surgeon: Marcina Millard, MD;  Location: [facility];  Service: Cardiovascular;  Laterality: Left;  . METATARSAL OSTEOTOMY  dec 2012   right foot, 5th MT  [name])    Social History   Socioeconomic History  . Marital status: Married    Spouse name: Not on file  . Number of children: Not on file  . Years of education: Not on file  . Highest education level: Not on file  Occupational History  . Not on file  Social Needs  . Financial resource strain: Not on file  . Food insecurity:    Worry: Not on file    Inability: Not on file  . Transportation needs:    Medical: Not on file    Non-medical: Not on file  Tobacco Use  . Smoking status: Never Smoker  . Smokeless tobacco: Never Used  Substance and Sexual Activity  . Alcohol use: Yes    Comment: occasional  . Drug use: No  . Sexual activity: Not  Currently  Lifestyle  . Physical activity:    Days per week: Not on file    Minutes per session: Not on file  . Stress: Not on file  Relationships  . Social connections:    Talks on phone: Not on file    Gets together: Not on file    Attends religious service: Not on file    Active member of club or organization: Not on file    Attends meetings of clubs or organizations: Not on file    Relationship status: Not on file  . Intimate partner violence:    Fear of current or ex partner: Not on file    Emotionally abused: Not on file    Physically abused: Not on file    Forced sexual activity: Not on file  Other Topics Concern  . Not on file  Social History Narrative  . Not on file    Current Outpatient Medications on File Prior to Visit  Medication Sig Dispense Refill  . diclofenac sodium (VOLTAREN) 1 % GEL Apply 1 application topically 4 (four) times daily as needed (joint pain).    Marland Kitchen diphenhydramine-acetaminophen (TYLENOL PM) 25-500 MG TABS tablet Take 1 tablet by mouth at bedtime.    . metoprolol succinate (TOPROL-XL) 25 MG 24 hr tablet Take 50 mg by mouth daily.  No current facility-administered medications on file prior to visit.     Allergies  Allergen Reactions  . Naproxen Nausea And Vomiting  . Tramadol Nausea And Vomiting  . Vicodin [Hydrocodone-Acetaminophen] Nausea And Vomiting    Family History  Problem Relation Age of Onset  . Diabetes Father   . Heart disease Father   . Cancer Maternal Aunt        Breast, metastatic  . Cancer Maternal Grandmother        ovarian  . Thyroid disease Neg Hx     BP 122/78   Pulse 76   Wt 167 lb 12.8 oz (76.1 kg)   SpO2 97%   BMI 30.69 kg/m   Review of Systems No weight change    Objective:   Physical Exam VS: see vs page GEN: no distress NECK: several 2-3 cm thyroid nodules are easily palpable.     Assessment & Plan:  Hyperthyroidism, s/p RAI rx.  She needs prompt improvement, to be cleared for surgery.      Patient Instructions  blood tests are requested for you today.  We'll let you know about the results. Based on the results, I would prescribe for you a pill to take temporarily, while the radioactive iodine pill is working. If ever you have fever while taking methimazole, stop it and call us, even if the reason is obvious, because of the risk of a rare side-effect.    Please continue the same metoprolol.  Please come back for a follow-up appointment in 2-3 weeks.  Then I would hope to clear you for the surgery.

## 2018-05-18 ENCOUNTER — Ambulatory Visit (INDEPENDENT_AMBULATORY_CARE_PROVIDER_SITE_OTHER): Payer: Medicare Other | Admitting: Endocrinology

## 2018-05-18 ENCOUNTER — Inpatient Hospital Stay: Admission: RE | Admit: 2018-05-18 | Payer: 59 | Source: Ambulatory Visit

## 2018-05-18 ENCOUNTER — Encounter: Payer: Self-pay | Admitting: Endocrinology

## 2018-05-18 VITALS — BP 122/74 | HR 82 | Wt 167.6 lb

## 2018-05-18 DIAGNOSIS — E0590 Thyrotoxicosis, unspecified without thyrotoxic crisis or storm: Secondary | ICD-10-CM

## 2018-05-18 LAB — TSH: TSH: 0.07 u[IU]/mL — ABNORMAL LOW (ref 0.35–4.50)

## 2018-05-18 LAB — T4, FREE: Free T4: 0.89 ng/dL (ref 0.60–1.60)

## 2018-05-18 NOTE — Patient Instructions (Addendum)
blood tests are requested for you today.  We'll let you know about the results. If ever you have fever while taking methimazole, stop it and call us, even if the reason is obvious, because of the risk of a rare side-effect. Please come back for a follow-up appointment in 1 month. 

## 2018-05-18 NOTE — Progress Notes (Signed)
Subjective:    Patient ID: Brooke Tucker, female    DOB: Apr 25, 1951, 67 y.o.   MRN: [id number]  HPI Pt returns for f/u of hyperthyroidism (she was dx'ed with a multinodular goiter in 2008, and hyperthyroidism in early 2019; she had RAI on 04/14/18; tapazole was rx'ed while RAI is working, as she needs to be cleared for TKR soon--she wants to get done this summer, as this is when she has help at home).  She still has palpitations.  TKR is scheduled for 06/28/18.  Past Medical History:  Diagnosis Date  . Hypertension   . Multinodular goiter   . Ovarian cyst July 2008   laparscopic biopsy normal  . Rheumatoid arthritis(714.0)    managed by Beverley Fiedler with MTX    Past Surgical History:  Procedure Laterality Date  . COMBINED HYSTEROSCOPY DIAGNOSTIC / D&C  July 2008   [place]  . ESOPHAGOGASTRODUODENOSCOPY  July 2008   normal  . EXCISION MORTON'S NEUROMA     Left foot  . KNEE ARTHROSCOPY Left 01/07/2016   Procedure: ARTHROSCOPY KNEE, lateral release, partial lateral menisectomy, excision plica;  Surgeon: Kennedy Bucker, MD;  Location: [facility] OR[facility];  Service: Orthopedics;  Laterality: Left;  . LEFT HEART CATH AND CORONARY ANGIOGRAPHY Left 02/03/2018   Procedure: LEFT HEART CATH AND CORONARY ANGIOGRAPHY;  Surgeon: Marcina Millard, MD;  Location: [facility];  Service: Cardiovascular;  Laterality: Left;  . METATARSAL OSTEOTOMY  dec 2012   right foot, 5th MT  [name])    Social History   Socioeconomic History  . Marital status: Married    Spouse name: Not on file  . Number of children: Not on file  . Years of education: Not on file  . Highest education level: Not on file  Occupational History  . Not on file  Social Needs  . Financial resource strain: Not on file  . Food insecurity:    Worry: Not on file    Inability: Not on file  . Transportation needs:    Medical: Not on file    Non-medical: Not on file  Tobacco Use  . Smoking status: Never Smoker  . Smokeless tobacco:  Never Used  Substance and Sexual Activity  . Alcohol use: Yes    Comment: occasional  . Drug use: No  . Sexual activity: Not Currently  Lifestyle  . Physical activity:    Days per week: Not on file    Minutes per session: Not on file  . Stress: Not on file  Relationships  . Social connections:    Talks on phone: Not on file    Gets together: Not on file    Attends religious service: Not on file    Active member of club or organization: Not on file    Attends meetings of clubs or organizations: Not on file    Relationship status: Not on file  . Intimate partner violence:    Fear of current or ex partner: Not on file    Emotionally abused: Not on file    Physically abused: Not on file    Forced sexual activity: Not on file  Other Topics Concern  . Not on file  Social History Narrative  . Not on file    Current Outpatient Medications on File Prior to Visit  Medication Sig Dispense Refill  . diclofenac sodium (VOLTAREN) 1 % GEL Apply 1 application topically 4 (four) times daily as needed (joint pain).    Marland Kitchen diphenhydramine-acetaminophen (TYLENOL PM) 25-500 MG  TABS tablet Take 1 tablet by mouth at bedtime.    . methimazole (TAPAZOLE) 10 MG tablet Take 1 tablet (10 mg total) by mouth 2 (two) times daily. 60 tablet 2  . metoprolol succinate (TOPROL-XL) 25 MG 24 hr tablet Take 50 mg by mouth daily.      No current facility-administered medications on file prior to visit.     Allergies  Allergen Reactions  . Naproxen Nausea And Vomiting  . Tramadol Nausea And Vomiting  . Vicodin [Hydrocodone-Acetaminophen] Nausea And Vomiting    Family History  Problem Relation Age of Onset  . Diabetes Father   . Heart disease Father   . Cancer Maternal Aunt        Breast, metastatic  . Cancer Maternal Grandmother        ovarian  . Thyroid disease Neg Hx     BP 122/74   Pulse 82   Wt 167 lb 9.6 oz (76 kg)   SpO2 97%   BMI 30.65 kg/m    Review of Systems Denies fever.        Objective:   Physical Exam VITAL SIGNS:  See vs page GENERAL: no distress NECK: several 2-3 cm thyroid nodules are easily palpable.    Lab Results  Component Value Date   TSH 0.07 (L) 05/18/2018      Assessment & Plan:  Hyperthyroidism: not better yet.  Recheck in 2 weeks.  Please come back for a follow-up appointment in 1 month.

## 2018-05-19 ENCOUNTER — Other Ambulatory Visit: Payer: Self-pay | Admitting: Endocrinology

## 2018-05-19 DIAGNOSIS — E0590 Thyrotoxicosis, unspecified without thyrotoxic crisis or storm: Secondary | ICD-10-CM

## 2018-05-23 ENCOUNTER — Encounter: Payer: Self-pay | Admitting: Endocrinology

## 2018-05-24 ENCOUNTER — Telehealth: Payer: Self-pay | Admitting: Endocrinology

## 2018-05-24 ENCOUNTER — Encounter: Payer: Self-pay | Admitting: Endocrinology

## 2018-05-24 NOTE — Telephone Encounter (Signed)
Error

## 2018-05-25 ENCOUNTER — Other Ambulatory Visit (INDEPENDENT_AMBULATORY_CARE_PROVIDER_SITE_OTHER): Payer: Medicare Other

## 2018-05-25 ENCOUNTER — Encounter: Payer: Self-pay | Admitting: Endocrinology

## 2018-05-25 DIAGNOSIS — E0590 Thyrotoxicosis, unspecified without thyrotoxic crisis or storm: Secondary | ICD-10-CM

## 2018-05-25 LAB — T4, FREE: Free T4: 0.84 ng/dL (ref 0.60–1.60)

## 2018-05-25 LAB — TSH: TSH: 0.15 u[IU]/mL — ABNORMAL LOW (ref 0.35–4.50)

## 2018-05-26 ENCOUNTER — Other Ambulatory Visit: Payer: Self-pay | Admitting: Endocrinology

## 2018-05-26 DIAGNOSIS — E0590 Thyrotoxicosis, unspecified without thyrotoxic crisis or storm: Secondary | ICD-10-CM

## 2018-06-08 ENCOUNTER — Other Ambulatory Visit (INDEPENDENT_AMBULATORY_CARE_PROVIDER_SITE_OTHER): Payer: Medicare Other

## 2018-06-08 DIAGNOSIS — R5383 Other fatigue: Secondary | ICD-10-CM

## 2018-06-08 LAB — CBC WITH DIFFERENTIAL/PLATELET
BASOS ABS: 0 10*3/uL (ref 0.0–0.1)
Basophils Relative: 0.4 % (ref 0.0–3.0)
EOS ABS: 0.2 10*3/uL (ref 0.0–0.7)
Eosinophils Relative: 2.8 % (ref 0.0–5.0)
HEMATOCRIT: 40.8 % (ref 36.0–46.0)
HEMOGLOBIN: 13.5 g/dL (ref 12.0–15.0)
LYMPHS PCT: 21.8 % (ref 12.0–46.0)
Lymphs Abs: 1.7 10*3/uL (ref 0.7–4.0)
MCHC: 33.1 g/dL (ref 30.0–36.0)
MCV: 88.2 fl (ref 78.0–100.0)
MONO ABS: 0.6 10*3/uL (ref 0.1–1.0)
Monocytes Relative: 6.9 % (ref 3.0–12.0)
Neutro Abs: 5.4 10*3/uL (ref 1.4–7.7)
Neutrophils Relative %: 68.1 % (ref 43.0–77.0)
Platelets: 241 10*3/uL (ref 150.0–400.0)
RBC: 4.62 Mil/uL (ref 3.87–5.11)
RDW: 15.3 % (ref 11.5–15.5)
WBC: 8 10*3/uL (ref 4.0–10.5)

## 2018-06-08 LAB — COMPREHENSIVE METABOLIC PANEL
ALBUMIN: 3.9 g/dL (ref 3.5–5.2)
ALT: 14 U/L (ref 0–35)
AST: 16 U/L (ref 0–37)
Alkaline Phosphatase: 104 U/L (ref 39–117)
BILIRUBIN TOTAL: 0.4 mg/dL (ref 0.2–1.2)
BUN: 9 mg/dL (ref 6–23)
CALCIUM: 9.1 mg/dL (ref 8.4–10.5)
CHLORIDE: 100 meq/L (ref 96–112)
CO2: 29 meq/L (ref 19–32)
CREATININE: 0.73 mg/dL (ref 0.40–1.20)
GFR: 84.58 mL/min (ref >60.00)
Glucose, Bld: 91 mg/dL (ref 70–99)
Potassium: 4.3 mEq/L (ref 3.5–5.1)
SODIUM: 136 meq/L (ref 135–145)
Total Protein: 7.1 g/dL (ref 6.0–8.3)

## 2018-06-09 ENCOUNTER — Telehealth: Payer: Self-pay | Admitting: Endocrinology

## 2018-06-09 NOTE — Telephone Encounter (Signed)
Patient is call for result  of her blood test, want can she still get her results even if Dr Everardo All is not here? Please advise

## 2018-06-09 NOTE — Telephone Encounter (Signed)
I spoke with patient & explained to her that these labs were not drawn yesterday. I asked that she call back to [facility] to make lab appointment & to tell them specifically that she was wanting to have the labs drawn that Dr. Everardo All had ordered to check TSH & T4. Patient stated that she would do so. I also told her that when results came in they may just be released to my chart, so to also check there in a few days.

## 2018-06-10 ENCOUNTER — Other Ambulatory Visit (INDEPENDENT_AMBULATORY_CARE_PROVIDER_SITE_OTHER): Payer: Medicare Other

## 2018-06-10 ENCOUNTER — Encounter: Payer: Self-pay | Admitting: Endocrinology

## 2018-06-10 DIAGNOSIS — E0590 Thyrotoxicosis, unspecified without thyrotoxic crisis or storm: Secondary | ICD-10-CM

## 2018-06-10 LAB — TSH: TSH: 1.4 u[IU]/mL (ref 0.35–4.50)

## 2018-06-10 LAB — T4, FREE: FREE T4: 0.67 ng/dL (ref 0.60–1.60)

## 2018-06-14 ENCOUNTER — Encounter
Admission: RE | Admit: 2018-06-14 | Discharge: 2018-06-14 | Disposition: A | Discharge status: Home / Self Care | Payer: Medicare Other | Source: Ambulatory Visit | Attending: Orthopedic Surgery | Admitting: Orthopedic Surgery

## 2018-06-14 ENCOUNTER — Other Ambulatory Visit: Payer: Self-pay

## 2018-06-14 DIAGNOSIS — Z0183 Encounter for blood typing: Secondary | ICD-10-CM | POA: Insufficient documentation

## 2018-06-14 DIAGNOSIS — Z01812 Encounter for preprocedural laboratory examination: Secondary | ICD-10-CM | POA: Diagnosis not present

## 2018-06-14 HISTORY — DX: Unspecified osteoarthritis, unspecified site: M19.90

## 2018-06-14 HISTORY — DX: Thyrotoxicosis, unspecified without thyrotoxic crisis or storm: E05.90

## 2018-06-14 HISTORY — DX: Cardiac arrhythmia, unspecified: I49.9

## 2018-06-14 LAB — TYPE AND SCREEN
ABO/RH(D): O POS
Antibody Screen: NEGATIVE

## 2018-06-14 LAB — URINALYSIS, ROUTINE W REFLEX MICROSCOPIC
Bilirubin Urine: NEGATIVE
GLUCOSE, UA: NEGATIVE mg/dL
KETONES UR: NEGATIVE mg/dL
Leukocytes, UA: NEGATIVE
Nitrite: NEGATIVE
PH: 6 (ref 5.0–8.0)
Protein, ur: NEGATIVE mg/dL
SQUAMOUS EPITHELIAL / LPF: NONE SEEN (ref 0–5)
Specific Gravity, Urine: 1.008 (ref 1.005–1.030)

## 2018-06-14 LAB — PROTIME-INR
INR: 0.89
PROTHROMBIN TIME: 12 s (ref 11.4–15.2)

## 2018-06-14 LAB — APTT: APTT: 29 s (ref 24–36)

## 2018-06-14 LAB — SURGICAL PCR SCREEN
MRSA, PCR: NEGATIVE
STAPHYLOCOCCUS AUREUS: NEGATIVE

## 2018-06-14 LAB — SEDIMENTATION RATE: Sed Rate: 25 mm/hr (ref 0–30)

## 2018-06-14 NOTE — Patient Instructions (Addendum)
Your procedure is scheduled on: Tues 7/2 Report to Day Surgery. To find out your arrival time please call [phone] between 1PM - 3PM on Mon 7/1.  Remember: Instructions that are not followed completely may result in serious medical risk, up to and including death, or upon the discretion of your surgeon and anesthesiologist your surgery may need to be rescheduled.     _X__ 1. Do not eat food after midnight the night before your procedure.                 No gum chewing or hard candies. You may drink clear liquids up to 2 hours                 before you are scheduled to arrive for your surgery- DO not drink clear                 liquids within 2 hours of the start of your surgery.                 Clear Liquids include:  water, apple juice without pulp, clear carbohydrate                 drink such as Clearfast of Gartorade, Black Coffee or Tea (Do not add                 anything to coffee or tea).  __X__2.  On the morning of surgery brush your teeth with toothpaste and water, you may rinse your mouth with mouthwash if you wish.  Do not swallow any              toothpaste of mouthwash.     _X__ 3.  No Alcohol for 24 hours before or after surgery.   ___ 4.  Do Not Smoke or use e-cigarettes For 24 Hours Prior to Your Surgery.                 Do not use any chewable tobacco products for at least 6 hours prior to                 surgery.  ____  5.  Bring all medications with you on the day of surgery if instructed.   __x__  6.  Notify your doctor if there is any change in your medical condition      (cold, fever, infections).     Do not wear jewelry, make-up, hairpins, clips or nail polish. Do not wear lotions, powders, or perfumes. You may wear deodorant. Do not shave 48 hours prior to surgery. Men may shave face and neck. Do not bring valuables to the hospital.    [facility] is not responsible for any belongings or valuables.  Contacts, dentures or  bridgework may not be worn into surgery. Leave your suitcase in the car. After surgery it may be brought to your room. For patients admitted to the hospital, discharge time is determined by your treatment team.   Patients discharged the day of surgery will not be allowed to drive home.   Please read over the following fact sheets that you were given:   _x___ Take these medicines the morning of surgery with A SIP OF WATER:    1. methimazole (TAPAZOLE) 10 MG tablet  2. metoprolol succinate (TOPROL-XL) 25 MG 24 hr tablet  3.   4.  5.  6.  ____ Fleet Enema (as directed)   _x___ Use CHG Soap as directed  ____  Use inhalers on the day of surgery  ____ Stop metformin 2 days prior to surgery    ____ Take 1/2 of usual insulin dose the night before surgery. No insulin the morning          of surgery.   ____ Stop Coumadin/Plavix/aspirin on   __x__ Stop Anti-inflammatories  diclofenac sodium (VOLTAREN) 1 % GEL Friday night 6/28  Tylenol up to 4000mg  /24 hours (No ibuprofen or aleve x 1 week)   ____ Stop supplements until after surgery.    ____ Bring C-Pap to the hospital.

## 2018-06-15 LAB — URINE CULTURE
CULTURE: NO GROWTH
SPECIAL REQUESTS: NORMAL

## 2018-06-23 ENCOUNTER — Ambulatory Visit: Payer: 59 | Admitting: Endocrinology

## 2018-06-27 ENCOUNTER — Ambulatory Visit: Payer: 59 | Admitting: Endocrinology

## 2018-06-27 MED ORDER — CEFAZOLIN SODIUM-DEXTROSE 2-4 GM/100ML-% IV SOLN
2.0000 g | Freq: Once | INTRAVENOUS | Status: AC
  Administered 2018-06-28: 2 g via INTRAVENOUS

## 2018-06-27 MED ORDER — TRANEXAMIC ACID 1000 MG/10ML IV SOLN
1000.0000 mg | INTRAVENOUS | Status: AC
  Administered 2018-06-28: 1000 mg via INTRAVENOUS
  Filled 2018-06-27: qty 1100

## 2018-06-28 ENCOUNTER — Inpatient Hospital Stay
Admission: RE | Admit: 2018-06-28 | Discharge: 2018-07-01 | DRG: 470 | Disposition: A | Discharge status: Home Health | Payer: Medicare Other | Source: Ambulatory Visit | Attending: Orthopedic Surgery | Admitting: Orthopedic Surgery

## 2018-06-28 ENCOUNTER — Inpatient Hospital Stay: Payer: Medicare Other | Admitting: Anesthesiology

## 2018-06-28 ENCOUNTER — Encounter: Payer: Self-pay | Admitting: *Deleted

## 2018-06-28 ENCOUNTER — Encounter
Admission: RE | Disposition: A | Discharge status: Home Health | Payer: Self-pay | Source: Ambulatory Visit | Attending: Orthopedic Surgery

## 2018-06-28 ENCOUNTER — Other Ambulatory Visit: Payer: Self-pay

## 2018-06-28 ENCOUNTER — Inpatient Hospital Stay: Payer: Medicare Other

## 2018-06-28 DIAGNOSIS — G8918 Other acute postprocedural pain: Secondary | ICD-10-CM

## 2018-06-28 DIAGNOSIS — R509 Fever, unspecified: Secondary | ICD-10-CM | POA: Diagnosis not present

## 2018-06-28 DIAGNOSIS — M069 Rheumatoid arthritis, unspecified: Secondary | ICD-10-CM | POA: Diagnosis present

## 2018-06-28 DIAGNOSIS — Z886 Allergy status to analgesic agent status: Secondary | ICD-10-CM | POA: Diagnosis not present

## 2018-06-28 DIAGNOSIS — G629 Polyneuropathy, unspecified: Secondary | ICD-10-CM | POA: Diagnosis present

## 2018-06-28 DIAGNOSIS — I10 Essential (primary) hypertension: Secondary | ICD-10-CM | POA: Diagnosis present

## 2018-06-28 DIAGNOSIS — E0520 Thyrotoxicosis with toxic multinodular goiter without thyrotoxic crisis or storm: Secondary | ICD-10-CM | POA: Diagnosis present

## 2018-06-28 DIAGNOSIS — Z96652 Presence of left artificial knee joint: Secondary | ICD-10-CM

## 2018-06-28 DIAGNOSIS — M1712 Unilateral primary osteoarthritis, left knee: Secondary | ICD-10-CM | POA: Diagnosis present

## 2018-06-28 DIAGNOSIS — I499 Cardiac arrhythmia, unspecified: Secondary | ICD-10-CM | POA: Diagnosis present

## 2018-06-28 DIAGNOSIS — Z885 Allergy status to narcotic agent status: Secondary | ICD-10-CM | POA: Diagnosis not present

## 2018-06-28 DIAGNOSIS — Z79899 Other long term (current) drug therapy: Secondary | ICD-10-CM

## 2018-06-28 DIAGNOSIS — R110 Nausea: Secondary | ICD-10-CM | POA: Diagnosis not present

## 2018-06-28 HISTORY — PX: TOTAL KNEE ARTHROPLASTY: SHX125

## 2018-06-28 LAB — ABO/RH: ABO/RH(D): O POS

## 2018-06-28 SURGERY — ARTHROPLASTY, KNEE, TOTAL
Anesthesia: Spinal | Laterality: Left

## 2018-06-28 MED ORDER — MIDAZOLAM HCL 2 MG/2ML IJ SOLN
INTRAMUSCULAR | Status: AC
  Filled 2018-06-28: qty 2

## 2018-06-28 MED ORDER — ASPIRIN 81 MG PO CHEW
81.0000 mg | CHEWABLE_TABLET | Freq: Two times a day (BID) | ORAL | Status: DC
  Administered 2018-06-29 – 2018-07-01 (×5): 81 mg via ORAL
  Filled 2018-06-28 (×6): qty 1

## 2018-06-28 MED ORDER — METHIMAZOLE 10 MG PO TABS: 10.0000 mg | ORAL_TABLET | Freq: Two times a day (BID) | ORAL | Status: DC

## 2018-06-28 MED ORDER — SODIUM CHLORIDE 0.9 % IV SOLN
INTRAVENOUS | Status: DC
  Administered 2018-06-28 – 2018-06-29 (×2): via INTRAVENOUS

## 2018-06-28 MED ORDER — FENTANYL CITRATE (PF) 100 MCG/2ML IJ SOLN: 25.0000 ug | INTRAMUSCULAR | Status: DC | PRN

## 2018-06-28 MED ORDER — ONDANSETRON HCL 4 MG PO TABS: 4.0000 mg | ORAL_TABLET | Freq: Four times a day (QID) | ORAL | Status: DC | PRN

## 2018-06-28 MED ORDER — MENTHOL 3 MG MT LOZG
1.0000 | LOZENGE | OROMUCOSAL | Status: DC | PRN
  Filled 2018-06-28: qty 9

## 2018-06-28 MED ORDER — MIDAZOLAM HCL 5 MG/5ML IJ SOLN
INTRAMUSCULAR | Status: DC | PRN
  Administered 2018-06-28: 2 mg via INTRAVENOUS

## 2018-06-28 MED ORDER — FAMOTIDINE 20 MG PO TABS
ORAL_TABLET | ORAL | Status: AC
  Administered 2018-06-28: 20 mg via ORAL
  Filled 2018-06-28: qty 1

## 2018-06-28 MED ORDER — PHENOL 1.4 % MT LIQD
1.0000 | OROMUCOSAL | Status: DC | PRN
  Filled 2018-06-28: qty 177

## 2018-06-28 MED ORDER — METOCLOPRAMIDE HCL 10 MG PO TABS
5.0000 mg | ORAL_TABLET | Freq: Three times a day (TID) | ORAL | Status: DC | PRN
  Administered 2018-06-29: 10 mg via ORAL
  Filled 2018-06-28: qty 1

## 2018-06-28 MED ORDER — KETOROLAC TROMETHAMINE 30 MG/ML IJ SOLN
INTRAMUSCULAR | Status: DC | PRN
  Administered 2018-06-28: 30 mg via INTRAVENOUS

## 2018-06-28 MED ORDER — PROPOFOL 10 MG/ML IV BOLUS
INTRAVENOUS | Status: DC | PRN
  Administered 2018-06-28: 40 mg via INTRAVENOUS

## 2018-06-28 MED ORDER — BUPIVACAINE-EPINEPHRINE (PF) 0.25% -1:200000 IJ SOLN
INTRAMUSCULAR | Status: DC | PRN
  Administered 2018-06-28: 30 mL via PERINEURAL

## 2018-06-28 MED ORDER — BISACODYL 5 MG PO TBEC: 5.0000 mg | DELAYED_RELEASE_TABLET | Freq: Every day | ORAL | Status: DC | PRN

## 2018-06-28 MED ORDER — MOXIFLOXACIN HCL 0.5 % OP SOLN: 1.0000 [drp] | Freq: Three times a day (TID) | OPHTHALMIC | Status: DC

## 2018-06-28 MED ORDER — SODIUM CHLORIDE 0.9 % IJ SOLN
INTRAMUSCULAR | Status: DC | PRN
  Administered 2018-06-28: 50 mL via INTRAVENOUS

## 2018-06-28 MED ORDER — MORPHINE SULFATE (PF) 2 MG/ML IV SOLN: 0.5000 mg | INTRAVENOUS | Status: DC | PRN

## 2018-06-28 MED ORDER — METOPROLOL SUCCINATE ER 50 MG PO TB24
50.0000 mg | ORAL_TABLET | Freq: Every morning | ORAL | Status: DC
  Administered 2018-06-29 – 2018-07-01 (×3): 50 mg via ORAL
  Filled 2018-06-28 (×3): qty 1

## 2018-06-28 MED ORDER — PROPOFOL 500 MG/50ML IV EMUL
INTRAVENOUS | Status: AC
  Filled 2018-06-28: qty 50

## 2018-06-28 MED ORDER — ONDANSETRON HCL 4 MG/2ML IJ SOLN
4.0000 mg | Freq: Four times a day (QID) | INTRAMUSCULAR | Status: DC | PRN
Start: 2018-06-28 — End: 2018-07-01
  Administered 2018-06-28: 4 mg via INTRAVENOUS
  Filled 2018-06-28: qty 2

## 2018-06-28 MED ORDER — METHOCARBAMOL 500 MG PO TABS
500.0000 mg | ORAL_TABLET | Freq: Four times a day (QID) | ORAL | Status: DC | PRN
  Administered 2018-06-29 (×2): 500 mg via ORAL
  Filled 2018-06-28 (×2): qty 1

## 2018-06-28 MED ORDER — ACETAMINOPHEN 500 MG PO TABS
500.0000 mg | ORAL_TABLET | Freq: Four times a day (QID) | ORAL | Status: DC
  Administered 2018-06-28 – 2018-06-29 (×3): 500 mg via ORAL
  Filled 2018-06-28 (×4): qty 1

## 2018-06-28 MED ORDER — PROPOFOL 500 MG/50ML IV EMUL
INTRAVENOUS | Status: DC | PRN
  Administered 2018-06-28: 75 ug/kg/min via INTRAVENOUS

## 2018-06-28 MED ORDER — METOCLOPRAMIDE HCL 5 MG/ML IJ SOLN
5.0000 mg | Freq: Three times a day (TID) | INTRAMUSCULAR | Status: DC | PRN
  Administered 2018-06-28: 10 mg via INTRAVENOUS
  Filled 2018-06-28: qty 2

## 2018-06-28 MED ORDER — FAMOTIDINE 20 MG PO TABS
20.0000 mg | ORAL_TABLET | Freq: Once | ORAL | Status: AC
  Administered 2018-06-28: 20 mg via ORAL

## 2018-06-28 MED ORDER — SODIUM CHLORIDE 0.9 % IV SOLN
INTRAVENOUS | Status: DC | PRN
  Administered 2018-06-28: 60 mL

## 2018-06-28 MED ORDER — ZOLPIDEM TARTRATE 5 MG PO TABS: 5.0000 mg | ORAL_TABLET | Freq: Every evening | ORAL | Status: DC | PRN

## 2018-06-28 MED ORDER — DOCUSATE SODIUM 100 MG PO CAPS
100.0000 mg | ORAL_CAPSULE | Freq: Two times a day (BID) | ORAL | Status: DC
  Administered 2018-06-28 – 2018-07-01 (×6): 100 mg via ORAL
  Filled 2018-06-28 (×6): qty 1

## 2018-06-28 MED ORDER — CEFAZOLIN SODIUM-DEXTROSE 2-4 GM/100ML-% IV SOLN
INTRAVENOUS | Status: AC
  Filled 2018-06-28: qty 100

## 2018-06-28 MED ORDER — OXYCODONE HCL 5 MG PO TABS
5.0000 mg | ORAL_TABLET | ORAL | Status: DC | PRN
  Administered 2018-06-28 – 2018-07-01 (×12): 5 mg via ORAL
  Filled 2018-06-28 (×12): qty 1

## 2018-06-28 MED ORDER — MAGNESIUM CITRATE PO SOLN
1.0000 | Freq: Once | ORAL | Status: AC | PRN
  Administered 2018-06-30: 1 via ORAL
  Filled 2018-06-28 (×2): qty 296

## 2018-06-28 MED ORDER — MORPHINE SULFATE 10 MG/ML IJ SOLN
INTRAMUSCULAR | Status: DC | PRN
  Administered 2018-06-28: 10 mg via INTRAVENOUS

## 2018-06-28 MED ORDER — SULFACETAMIDE-PREDNISOLONE 10-0.2 % OP SUSP
1.0000 [drp] | Freq: Four times a day (QID) | OPHTHALMIC | Status: DC
  Filled 2018-06-28: qty 5

## 2018-06-28 MED ORDER — SENNOSIDES-DOCUSATE SODIUM 8.6-50 MG PO TABS: 1.0000 | ORAL_TABLET | Freq: Every evening | ORAL | Status: DC | PRN

## 2018-06-28 MED ORDER — ONDANSETRON HCL 4 MG/2ML IJ SOLN: 4.0000 mg | Freq: Once | INTRAMUSCULAR | Status: DC | PRN

## 2018-06-28 MED ORDER — NEOMYCIN-POLYMYXIN B GU 40-200000 IR SOLN
Status: DC | PRN
  Administered 2018-06-28: 14 mL

## 2018-06-28 MED ORDER — ALUM & MAG HYDROXIDE-SIMETH 200-200-20 MG/5ML PO SUSP: 30.0000 mL | ORAL | Status: DC | PRN

## 2018-06-28 MED ORDER — LACTATED RINGERS IV SOLN
INTRAVENOUS | Status: DC
  Administered 2018-06-28 (×2): via INTRAVENOUS

## 2018-06-28 MED ORDER — CEFAZOLIN SODIUM-DEXTROSE 2-4 GM/100ML-% IV SOLN
2.0000 g | Freq: Four times a day (QID) | INTRAVENOUS | Status: AC
  Administered 2018-06-28 (×2): 2 g via INTRAVENOUS
  Filled 2018-06-28 (×2): qty 100

## 2018-06-28 MED ORDER — SODIUM CHLORIDE 0.9 % IV SOLN
INTRAVENOUS | Status: DC | PRN
  Administered 2018-06-28: 15 ug/min via INTRAVENOUS

## 2018-06-28 MED ORDER — GABAPENTIN 300 MG PO CAPS
300.0000 mg | ORAL_CAPSULE | Freq: Three times a day (TID) | ORAL | Status: DC
  Administered 2018-06-28 – 2018-07-01 (×9): 300 mg via ORAL
  Filled 2018-06-28 (×9): qty 1

## 2018-06-28 MED ORDER — ACETAMINOPHEN 325 MG PO TABS
325.0000 mg | ORAL_TABLET | Freq: Four times a day (QID) | ORAL | Status: DC | PRN
  Filled 2018-06-28: qty 2

## 2018-06-28 MED ORDER — BUPIVACAINE HCL (PF) 0.5 % IJ SOLN
INTRAMUSCULAR | Status: DC | PRN
  Administered 2018-06-28: 3 mL

## 2018-06-28 MED ORDER — METHOCARBAMOL 1000 MG/10ML IJ SOLN
500.0000 mg | Freq: Four times a day (QID) | INTRAVENOUS | Status: DC | PRN
  Filled 2018-06-28: qty 5

## 2018-06-28 SURGICAL SUPPLY — 69 items
BANDAGE ACE 6X5 VEL STRL LF (GAUZE/BANDAGES/DRESSINGS) ×3 IMPLANT
BLADE SAW 1 (BLADE) ×3 IMPLANT
BLOCK CUTTING FEMUR 3 LT MED (MISCELLANEOUS) ×3 IMPLANT
BLOCK CUTTING TIBIAL 4 (MISCELLANEOUS) ×3 IMPLANT
BLOCK CUTTING TIBIAL 4 MEDAC (MISCELLANEOUS) ×2 IMPLANT
CANISTER SUCT 1200ML W/VALVE (MISCELLANEOUS) ×3 IMPLANT
CANISTER SUCT 3000ML PPV (MISCELLANEOUS) ×6 IMPLANT
CEMENT HV SMART SET (Cement) ×6 IMPLANT
CEMENT PATELLA RESURF SZ1 (Cement) ×3 IMPLANT
CHLORAPREP W/TINT 26ML (MISCELLANEOUS) ×6 IMPLANT
COOLER POLAR GLACIER W/PUMP (MISCELLANEOUS) ×3 IMPLANT
CUFF TOURN 24 STER (MISCELLANEOUS) IMPLANT
CUFF TOURN 30 STER DUAL PORT (MISCELLANEOUS) ×3 IMPLANT
DRAPE SHEET LG 3/4 BI-LAMINATE (DRAPES) ×6 IMPLANT
ELECT CAUTERY BLADE 6.4 (BLADE) ×3 IMPLANT
ELECT REM PT RETURN 9FT ADLT (ELECTROSURGICAL) ×3
ELECTRODE REM PT RTRN 9FT ADLT (ELECTROSURGICAL) ×1 IMPLANT
FEM COMP SZ3 LFT (Joint) ×2 IMPLANT
FEMUR BONE MODEL 4.9010 MEDACT (MISCELLANEOUS) ×2 IMPLANT
GAUZE PETRO XEROFOAM 1X8 (MISCELLANEOUS) ×3 IMPLANT
GAUZE SPONGE 4X4 12PLY STRL (GAUZE/BANDAGES/DRESSINGS) ×3 IMPLANT
GLOVE BIOGEL PI IND STRL 9 (GLOVE) ×1 IMPLANT
GLOVE BIOGEL PI INDICATOR 9 (GLOVE) ×2
GLOVE INDICATOR 8.0 STRL GRN (GLOVE) ×3 IMPLANT
GLOVE SURG ORTHO 8.0 STRL STRW (GLOVE) ×3 IMPLANT
GLOVE SURG SYN 9.0  PF PI (GLOVE) ×2
GLOVE SURG SYN 9.0 PF PI (GLOVE) ×1 IMPLANT
GOWN SRG 2XL LVL 4 RGLN SLV (GOWNS) ×1 IMPLANT
GOWN STRL NON-REIN 2XL LVL4 (GOWNS) ×3
GOWN STRL REUS W/ TWL LRG LVL3 (GOWN DISPOSABLE) ×1 IMPLANT
GOWN STRL REUS W/ TWL XL LVL3 (GOWN DISPOSABLE) ×1 IMPLANT
GOWN STRL REUS W/TWL LRG LVL3 (GOWN DISPOSABLE) ×3
GOWN STRL REUS W/TWL XL LVL3 (GOWN DISPOSABLE) ×3
HOLDER FOLEY CATH W/STRAP (MISCELLANEOUS) ×3 IMPLANT
HOOD PEEL AWAY FLYTE STAYCOOL (MISCELLANEOUS) ×6 IMPLANT
IMMBOLIZER KNEE 19 BLUE UNIV (SOFTGOODS) ×1 IMPLANT
KIT PREVENA INCISION MGT20CM45 (CANNISTER) ×2 IMPLANT
KIT TURNOVER KIT A (KITS) ×3 IMPLANT
KNEE INSERT TIBIAL S2 02070212 (Insert) ×2 IMPLANT
KNIFE SCULPS 14X20 (INSTRUMENTS) ×3 IMPLANT
NDL SAFETY ECLIPSE 18X1.5 (NEEDLE) ×1 IMPLANT
NDL SPNL 18GX3.5 QUINCKE PK (NEEDLE) ×1 IMPLANT
NEEDLE HYPO 18GX1.5 SHARP (NEEDLE) ×3
NEEDLE SPNL 18GX3.5 QUINCKE PK (NEEDLE) ×3 IMPLANT
NEEDLE SPNL 20GX3.5 QUINCKE YW (NEEDLE) ×3 IMPLANT
NS IRRIG 1000ML POUR BTL (IV SOLUTION) ×3 IMPLANT
PACK TOTAL KNEE (MISCELLANEOUS) ×3 IMPLANT
PAD WRAPON POLAR KNEE (MISCELLANEOUS) ×1 IMPLANT
PULSAVAC PLUS IRRIG FAN TIP (DISPOSABLE) ×3
SCALPEL PROTECTED #10 DISP (BLADE) ×6 IMPLANT
SOL .9 NS 3000ML IRR  AL (IV SOLUTION) ×2
SOL .9 NS 3000ML IRR AL (IV SOLUTION) ×1
SOL .9 NS 3000ML IRR UROMATIC (IV SOLUTION) ×1 IMPLANT
STAPLER SKIN PROX 35W (STAPLE) ×3 IMPLANT
STEM EXTENSION 11MMX30MM (Stem) ×2 IMPLANT
SUCTION FRAZIER HANDLE 10FR (MISCELLANEOUS) ×2
SUCTION TUBE FRAZIER 10FR DISP (MISCELLANEOUS) ×1 IMPLANT
SUT DVC 2 QUILL PDO  T11 36X36 (SUTURE) ×2
SUT DVC 2 QUILL PDO T11 36X36 (SUTURE) ×1 IMPLANT
SUT V-LOC 90 ABS DVC 3-0 CL (SUTURE) ×3 IMPLANT
SYR 20CC LL (SYRINGE) ×3 IMPLANT
SYR 50ML LL SCALE MARK (SYRINGE) ×6 IMPLANT
TIBIAL TRAY FIXED MEDACTA 0207 (Joint) ×2 IMPLANT
TIP FAN IRRIG PULSAVAC PLUS (DISPOSABLE) ×1 IMPLANT
TOWEL OR 17X26 4PK STRL BLUE (TOWEL DISPOSABLE) ×3 IMPLANT
TOWER CARTRIDGE SMART MIX (DISPOSABLE) ×3 IMPLANT
TRAY FOLEY MTR SLVR 16FR STAT (SET/KITS/TRAYS/PACK) ×3 IMPLANT
WND VAC CANISTER 500ML (MISCELLANEOUS) ×2 IMPLANT
WRAPON POLAR PAD KNEE (MISCELLANEOUS) ×3

## 2018-06-28 NOTE — Progress Notes (Signed)
Chaplain responded to an Or for an AD. Pt was eating dinner. Chaplain explained difference of LW and HCPOA. Pt said she was a little woozy and will review with her husband. Chaplain let her know if there are any questions they can page chaplain.    06/28/18 1200  Clinical Encounter Type  Visited With Patient  Visit Type Initial  Referral From Nurse  Spiritual Encounters  Spiritual Needs Brochure  Advance Directives (For Healthcare)  Does Patient Have a Medical Advance Directive? No  Would patient like information on creating a medical advance directive? Yes (Inpatient - patient requests chaplain consult to create a medical advance directive)  Mental Health Advance Directives  Does Patient Have a Mental Health Advance Directive? No  Would patient like information on creating a mental health advance directive? No - Patient declined

## 2018-06-28 NOTE — Anesthesia Preprocedure Evaluation (Addendum)
Anesthesia Evaluation  Patient identified by MRN, date of birth, ID band Patient awake    Reviewed: Allergy & Precautions, NPO status , Patient's Chart, lab work & pertinent test results  History of Anesthesia Complications Negative for: history of anesthetic complications  Airway Mallampati: II       Dental  (+) Teeth Intact, Dental Advidsory Given   Pulmonary neg pulmonary ROS,           Cardiovascular Exercise Tolerance: Good hypertension, Pt. on medications (-) angina(-) CAD, (-) Past MI, (-) Cardiac Stents and (-) CABG + dysrhythmias (-) Valvular Problems/Murmurs     Neuro/Psych negative neurological ROS     GI/Hepatic negative GI ROS, Neg liver ROS,   Endo/Other  neg diabetesHyperthyroidism   Renal/GU negative Renal ROS     Musculoskeletal  (+) Arthritis , Rheumatoid disorders,    Abdominal   Peds  Hematology negative hematology ROS (+)   Anesthesia Other Findings Past Medical History: No date: Arthritis No date: Dysrhythmia     Comment:  tachycardia  No date: Hypertension No date: Hyperthyroidism No date: Multinodular goiter July 2008: Ovarian cyst     Comment:  laparscopic biopsy normal No date: Rheumatoid arthritis(714.0)     Comment:  managed by Beverley Fiedler with MTX   Reproductive/Obstetrics                            Anesthesia Physical  Anesthesia Plan  ASA: II  Anesthesia Plan: Spinal   Post-op Pain Management:    Induction:   PONV Risk Score and Plan: 3 and Ondansetron and Dexamethasone  Airway Management Planned: Nasal Cannula  Additional Equipment:   Intra-op Plan:   Post-operative Plan:   Informed Consent: I have reviewed the patients History and Physical, chart, labs and discussed the procedure including the risks, benefits and alternatives for the proposed anesthesia with the patient or authorized representative who has indicated his/her  understanding and acceptance.     Plan Discussed with: Anesthesiologist and CRNA  Anesthesia Plan Comments:        Anesthesia Quick Evaluation

## 2018-06-28 NOTE — H&P (Signed)
Reviewed paper H+P, will be scanned into chart. No changes noted.  

## 2018-06-28 NOTE — Anesthesia Post-op Follow-up Note (Signed)
Anesthesia QCDR form completed.        

## 2018-06-28 NOTE — Anesthesia Procedure Notes (Signed)
Spinal  Patient location during procedure: OR Start time: 06/28/2018 8:25 AM End time: 06/28/2018 8:31 AM Staffing Anesthesiologist: Inika Clan, MD Resident/CRNA: Nelda Marseille, CRNA Performed: resident/CRNA  Preanesthetic Checklist Completed: patient identified, site marked, surgical consent, pre-op evaluation, timeout performed, IV checked, risks and benefits discussed and monitors and equipment checked Spinal Block Patient position: sitting Prep: ChloraPrep Patient monitoring: heart rate, continuous pulse ox, blood pressure and cardiac monitor Approach: midline Location: L3-4 Injection technique: single-shot Needle Needle type: Whitacre and Introducer  Needle gauge: 24 G Needle length: 9 cm Assessment Sensory level: T10 Additional Notes Negative paresthesia. Negative blood return. Positive free-flowing CSF. Expiration date of kit checked and confirmed. Patient tolerated procedure well, without complications.

## 2018-06-28 NOTE — Op Note (Signed)
06/28/2018  10:08 AM  PATIENT:  Brooke Tucker  67 y.o. female  PRE-OPERATIVE DIAGNOSIS:  primary osteoarthritis of left knee  POST-OPERATIVE DIAGNOSIS:  primary osteoarthritis of left knee  PROCEDURE:  Procedure(s): TOTAL KNEE ARTHROPLASTY (Left)  SURGEON: Leitha Schuller, MD  ASSISTANTS: Cranston Neighbor, PA-C  ANESTHESIA:   spinal  EBL:  Total I/O In: 1200 [I.V.:1200] Out: 350 [Urine:300; Blood:50]  BLOOD ADMINISTERED:none  DRAINS: none   LOCAL MEDICATIONS USED:  MARCAINE    and OTHER Exparel morphine and Toradol  SPECIMEN:  No Specimen  DISPOSITION OF SPECIMEN:  N/A  COUNTS:  YES  TOURNIQUET:  * Missing tourniquet times found for documented tourniquets in log: [id number] *  IMPLANTS: Medacta GMK PS 3 femur, 2 tibia left with 12 mm PS insert, 1 patella, all components cemented  DICTATION: .Dragon Dictation patient brought the operating room and after adequate anesthesia was obtained the left leg prepped review sterile fashion anterior thigh.  After patient identification and timeout procedures were completed, tourniquet was raised.  A midline skin incision was made followed by medial parapatellar arthrotomy.  Inspection revealed extensive synovitis especially in the suprapatellar pouch.  There was eburnated bone in the lateral compartment moderate medial and patellofemoral osteoarthritis.  The ACL and PCL were excised along with fat pad.  The proximal tibia cutting guide was applied proximal tibia cut carried out followed by distal femoral resection and the 4-in-1 cutting guide for the distal femur.  After anterior posterior and chamfer cuts were made the residual posterior horns of the menisci were excised.  The proximal tibia cutting guide was applied baseplate with proximal tibial preparation keel punch.  The distal femoral trial was placed and a 12 mm insert gave excellent stability through range of motion.  The distal femoral drill holes were made along the notch cut PS drill  hole.  All these trials were then removed and the patellar cut carried out.  Patella measured 19 mm so less resection was carried out and the patella sized to a size 1.  The above local anesthetic was infiltrated in the periarticular tissues and bony surfaces thoroughly irrigated and dried.  Tibial baseplate was cemented in place followed by the polyethylene component and the femoral component knee held in extension as the cement set up with the patella but the flap in place.  After the cemented set excess cement was removed and the knee was thoroughly irrigated with a small lateral release carried out because of a tight band laterally.  The patella tracked well with no touch technique full range of motion and good stability in extension and flexion.  The arthrotomy was repaired using a heavy Quill followed by 3-0 v-loc subcutaneously and skin staples followed by incisional wound VAC  PLAN OF CARE: Admit to inpatient   PATIENT DISPOSITION:  PACU - hemodynamically stable.

## 2018-06-28 NOTE — Evaluation (Signed)
Physical Therapy Evaluation Patient Details Name: Brooke Tucker MRN: [id number] DOB: 1951/08/31 Today's Date: 06/28/2018   History of Present Illness  67 y/o female s/p L total knee replacement 06/28/18.  Clinical Impression  Pt eager to work with PT but generally feeling poorly t/o the session and essentially nauseous the entire time.  She showed great effort with supine exercises and with mobility/taking a few steps but could not really do as well as she likely would have because of feeling sick.  Pt was able to engage quad but lacked solid quad set and could not do SLRs w/o assist.  Pt hoping to go home and should be able to per progress if she is better able to participate with PT.      Follow Up Recommendations (per progress, likely HHPT but will need to do more)    Equipment Recommendations  Rolling walker with 5" wheels    Recommendations for Other Services       Precautions / Restrictions Precautions Precautions: Knee;Fall Restrictions RLE Weight Bearing: Weight bearing as tolerated      Mobility  Bed Mobility Overal bed mobility: Modified Independent             General bed mobility comments: Pt slow and labored getting out and back into bed, but able to do it without direct phyiscal assist from PT  Transfers Overall transfer level: Modified independent Equipment used: Rolling walker (2 wheeled)             General transfer comment: Highly reliant on UEs to stand and very cautious but able to rise w/o assist  Ambulation/Gait Ambulation/Gait assistance: Min assist Gait Distance (Feet): 5 Feet Assistive device: Rolling walker (2 wheeled)       General Gait Details: Pt able to take a few turning and side steps near the bed but felt poorly and could not manage doing more than some minimal stepping this date  Stairs            Wheelchair Mobility    Modified Rankin (Stroke Patients Only)       Balance Overall balance assessment: Modified  Independent(reliant on walker in standing)                                           Pertinent Vitals/Pain Pain Assessment: 0-10 Pain Score: 8  Pain Location: R knee    Home Living Family/patient expects to be discharged to:: Private residence Living Arrangements: Spouse/significant other;Parent Available Help at Discharge: Family   Home Access: Stairs to enter   Secretary/administrator of Steps: 1 Home Layout: One level Home Equipment: Environmental consultant - 4 wheels      Prior Function Level of Independence: Independent         Comments: Pt able to drive and be relatively active     Hand Dominance        Extremity/Trunk Assessment   Upper Extremity Assessment Upper Extremity Assessment: Overall WFL for tasks assessed    Lower Extremity Assessment Lower Extremity Assessment: Overall WFL for tasks assessed(expected L post-surgical weakness, moderate quad control)       Communication   Communication: No difficulties  Cognition Arousal/Alertness: Awake/alert Behavior During Therapy: WFL for tasks assessed/performed Overall Cognitive Status: Within Functional Limits for tasks assessed  General Comments General comments (skin integrity, edema, etc.): Pt with nausea and generally feeling poorly t/o the session, did vomit ~1 hr prior to session, BP was normal    Exercises Total Joint Exercises Ankle Circles/Pumps: AROM;10 reps Quad Sets: Strengthening;10 reps Short Arc Quad: AROM;AAROM;10 reps Hip ABduction/ADduction: AROM;Strengthening;10 reps Straight Leg Raises: AAROM;5 reps Goniometric ROM: 1-80   Assessment/Plan    PT Assessment Patient needs continued PT services  PT Problem List Decreased strength;Decreased range of motion;Decreased activity tolerance;Decreased balance;Decreased mobility;Decreased coordination;Decreased knowledge of use of DME;Decreased safety awareness;Pain       PT  Treatment Interventions      PT Goals (Current goals can be found in the Care Plan section)  Acute Rehab PT Goals Patient Stated Goal: go home PT Goal Formulation: With patient Time For Goal Achievement: 07/12/18 Potential to Achieve Goals: Good    Frequency BID   Barriers to discharge        Co-evaluation               AM-PAC PT "6 Clicks" Daily Activity  Outcome Measure Difficulty turning over in bed (including adjusting bedclothes, sheets and blankets)?: A Little Difficulty moving from lying on back to sitting on the side of the bed? : A Lot Difficulty sitting down on and standing up from a chair with arms (e.g., wheelchair, bedside commode, etc,.)?: A Little Help needed moving to and from a bed to chair (including a wheelchair)?: A Little Help needed walking in hospital room?: A Lot Help needed climbing 3-5 steps with a railing? : A Lot 6 Click Score: 15    End of Session Equipment Utilized During Treatment: Gait belt Activity Tolerance: (limited by nausea and pain) Patient left: with bed alarm set;with call bell/phone within reach;with family/visitor present Nurse Communication: Mobility status PT Visit Diagnosis: Muscle weakness (generalized) (M62.81);Difficulty in walking, not elsewhere classified (R26.2)    Time: 6203-5597 PT Time Calculation (min) (ACUTE ONLY): 29 min   Charges:   PT Evaluation $PT Eval Low Complexity: 1 Low PT Treatments $Therapeutic Exercise: 8-22 mins   PT G Codes:        Malachi Pro, DPT 06/28/2018, 5:57 PM

## 2018-06-28 NOTE — Transfer of Care (Signed)
Immediate Anesthesia Transfer of Care Note  Patient: Brooke Tucker  Procedure(s) Performed: TOTAL KNEE ARTHROPLASTY (Left )  Patient Location: PACU  Anesthesia Type:Spinal  Level of Consciousness: awake, alert  and oriented  Airway & Oxygen Therapy: Patient Spontanous Breathing and Patient connected to face mask oxygen  Post-op Assessment: Report given to RN and Post -op Vital signs reviewed and stable  Post vital signs: Reviewed and stable  Last Vitals:  Vitals Value Taken Time  BP    Temp    Pulse 68 06/28/2018 10:06 AM  Resp    SpO2 97 % 06/28/2018 10:06 AM  Vitals shown include unvalidated device data.  Last Pain:  Vitals:   06/28/18 0708  TempSrc: Tympanic  PainSc: 0-No pain         Complications: No apparent anesthesia complications

## 2018-06-28 NOTE — NC FL2 (Signed)
[place] MEDICAID FL2 LEVEL OF CARE SCREENING TOOL     IDENTIFICATION  Patient Name: Brooke Tucker Birthdate: [birth year] Sex: female Admission Date (Current Location): 06/28/2018  [place] and [place] Number:  Chiropodist and Address:  [facility], [address] [place], [place] [address]      Provider Number: [id number]  Attending Physician Name and Address:  Kennedy Bucker, MD  Relative Name and Phone Number:       Current Level of Care: Hospital Recommended Level of Care: Skilled Nursing Facility Prior Approval Number:    Date Approved/Denied:   PASRR Number: [id number])  Discharge Plan: SNF    Current Diagnoses: Patient Active Problem List   Diagnosis Date Noted  . S/P TKR (total knee replacement) using cement, left 06/28/2018  . Hyperthyroidism 04/26/2018  . Multinodular goiter 04/01/2016  . Tear of LCL (lateral collateral ligament) of knee 10/10/2015  . Lateral meniscus derangement 10/10/2015  . Knee pain, left anterior 09/27/2015  . Vitamin D deficiency 03/16/2015  . Abnormal mammogram with microcalcification 03/19/2014  . Encounter for long-term (current) use of other medications 10/22/2012  . Encounter for preventive health examination 10/22/2012  . Lichen sclerosus et atrophicus 10/22/2012  . Osteoporosis 10/20/2012  . Obesity (BMI 30-39.9) 09/28/2011  . Rheumatoid arthritis (HCC)     Orientation RESPIRATION BLADDER Height & Weight     Self, Time, Situation, Place  Normal Continent Weight: 168 lb (76.2 kg) Height:  5\' 1"  (154.9 cm)  BEHAVIORAL SYMPTOMS/MOOD NEUROLOGICAL BOWEL NUTRITION STATUS      Continent Diet(Diet: Regular )  AMBULATORY STATUS COMMUNICATION OF NEEDS Skin   Extensive Assist Verbally Surgical wounds, Wound Vac(Incision: Left Knee. Provena Wound Vac. )                       Personal Care Assistance Level of Assistance  Bathing, Feeding, Dressing Bathing Assistance: Limited  assistance Feeding assistance: Independent Dressing Assistance: Limited assistance     Functional Limitations Info  Sight, Hearing, Speech Sight Info: Adequate Hearing Info: Adequate Speech Info: Adequate    SPECIAL CARE FACTORS FREQUENCY  PT (By licensed PT), OT (By licensed OT)     PT Frequency: (5) OT Frequency: (5)            Contractures      Additional Factors Info  Code Status, Allergies Code Status Info: (Full Code. ) Allergies Info: (Naproxen, Tramadol, Vicodin Hydrocodone-acetaminophen)           Current Medications (06/28/2018):  This is the current hospital active medication list Current Facility-Administered Medications  Medication Dose Route Frequency Provider Last Rate Last Dose  . 0.9 %  sodium chloride infusion   Intravenous Continuous Kennedy Bucker, MD 75 mL/hr at 06/28/18 1318    . [START ON 06/29/2018] acetaminophen (TYLENOL) tablet 325-650 mg  325-650 mg Oral Q6H PRN Kennedy Bucker, MD      . acetaminophen (TYLENOL) tablet 500 mg  500 mg Oral Q6H Kennedy Bucker, MD      . alum & mag hydroxide-simeth (MAALOX/MYLANTA) 200-200-20 MG/5ML suspension 30 mL  30 mL Oral Q4H PRN Kennedy Bucker, MD      . aspirin chewable tablet 81 mg  81 mg Oral BID Kennedy Bucker, MD      . bisacodyl (DULCOLAX) EC tablet 5 mg  5 mg Oral Daily PRN Kennedy Bucker, MD      . ceFAZolin (ANCEF) IVPB 2g/100 mL premix  2 g Intravenous Q6H Kennedy Bucker, MD  Stopped at 06/28/18 1349  . docusate sodium (COLACE) capsule 100 mg  100 mg Oral BID Kennedy Bucker, MD      . gabapentin (NEURONTIN) capsule 300 mg  300 mg Oral TID Kennedy Bucker, MD   300 mg at 06/28/18 1501  . magnesium citrate solution 1 Bottle  1 Bottle Oral Once PRN Kennedy Bucker, MD      . menthol-cetylpyridinium (CEPACOL) lozenge 3 mg  1 lozenge Oral PRN Kennedy Bucker, MD       Or  . phenol (CHLORASEPTIC) mouth spray 1 spray  1 spray Mouth/Throat PRN Kennedy Bucker, MD      . methocarbamol (ROBAXIN) tablet 500 mg  500 mg Oral  Q6H PRN Kennedy Bucker, MD       Or  . methocarbamol (ROBAXIN) 500 mg in dextrose 5 % 50 mL IVPB  500 mg Intravenous Q6H PRN Kennedy Bucker, MD      . metoCLOPramide (REGLAN) tablet 5-10 mg  5-10 mg Oral Q8H PRN Kennedy Bucker, MD       Or  . metoCLOPramide (REGLAN) injection 5-10 mg  5-10 mg Intravenous Q8H PRN Kennedy Bucker, MD      . Melene Muller ON 06/29/2018] metoprolol succinate (TOPROL-XL) 24 hr tablet 50 mg  50 mg Oral q morning - 10a Kennedy Bucker, MD      . morphine 2 MG/ML injection 0.5-1 mg  0.5-1 mg Intravenous Q2H PRN Kennedy Bucker, MD      . moxifloxacin (VIGAMOX) 0.5 % ophthalmic solution 1 drop  1 drop Right Eye TID Kennedy Bucker, MD      . ondansetron [facility]) tablet 4 mg  4 mg Oral Q6H PRN Kennedy Bucker, MD       Or  . ondansetron [facility]) injection 4 mg  4 mg Intravenous Q6H PRN Kennedy Bucker, MD   4 mg at 06/28/18 1316  . oxyCODONE (Oxy IR/ROXICODONE) immediate release tablet 5 mg  5 mg Oral Q3H PRN Kennedy Bucker, MD   5 mg at 06/28/18 1500  . senna-docusate (Senokot-S) tablet 1 tablet  1 tablet Oral QHS PRN Kennedy Bucker, MD      . sulfacetamide-prednisolone [facility]) ophthalmic suspension 1 drop  1 drop Right Eye QID Kennedy Bucker, MD      . zolpidem [facility]) tablet 5 mg  5 mg Oral QHS PRN Kennedy Bucker, MD         Discharge Medications: Please see discharge summary for a list of discharge medications.  Relevant Imaging Results:  Relevant Lab Results:   Additional Information (SSN: [id number])  [name], Darleen Crocker, LCSW

## 2018-06-29 ENCOUNTER — Encounter: Payer: Self-pay | Admitting: Orthopedic Surgery

## 2018-06-29 LAB — BASIC METABOLIC PANEL WITH GFR
Anion gap: 5 (ref 5–15)
BUN: 6 mg/dL — ABNORMAL LOW (ref 8–23)
CO2: 27 mmol/L (ref 22–32)
Calcium: 8 mg/dL — ABNORMAL LOW (ref 8.9–10.3)
Chloride: 106 mmol/L (ref 98–111)
Creatinine, Ser: 0.56 mg/dL (ref 0.44–1.00)
GFR calc Af Amer: >60 mL/min
GFR calc non Af Amer: >60 mL/min
Glucose, Bld: 107 mg/dL — ABNORMAL HIGH (ref 70–99)
Potassium: 4 mmol/L (ref 3.5–5.1)
Sodium: 138 mmol/L (ref 135–145)

## 2018-06-29 LAB — CBC
HCT: 34.4 % — ABNORMAL LOW (ref 35.0–47.0)
Hemoglobin: 11.7 g/dL — ABNORMAL LOW (ref 12.0–16.0)
MCH: 30 pg (ref 26.0–34.0)
MCHC: 33.9 g/dL (ref 32.0–36.0)
MCV: 88.6 fL (ref 80.0–100.0)
Platelets: 178 10*3/uL (ref 150–440)
RBC: 3.89 MIL/uL (ref 3.80–5.20)
RDW: 15.8 % — ABNORMAL HIGH (ref 11.5–14.5)
WBC: 6.6 10*3/uL (ref 3.6–11.0)

## 2018-06-29 MED ORDER — DOCUSATE SODIUM 100 MG PO CAPS: 100.0000 mg | ORAL_CAPSULE | Freq: Two times a day (BID) | ORAL | 0 refills | Status: DC

## 2018-06-29 MED ORDER — METHOCARBAMOL 500 MG PO TABS: 500.0000 mg | ORAL_TABLET | Freq: Four times a day (QID) | ORAL | 0 refills | Status: DC | PRN

## 2018-06-29 MED ORDER — ACETAMINOPHEN 325 MG PO TABS: 325.0000 mg | ORAL_TABLET | Freq: Four times a day (QID) | ORAL | Status: DC | PRN

## 2018-06-29 MED ORDER — OXYCODONE HCL 5 MG PO TABS: 5.0000 mg | ORAL_TABLET | ORAL | 0 refills | Status: DC | PRN

## 2018-06-29 MED ORDER — ASPIRIN 81 MG PO CHEW: 81.0000 mg | CHEWABLE_TABLET | Freq: Two times a day (BID) | ORAL | 0 refills | Status: DC

## 2018-06-29 MED ORDER — SCOPOLAMINE 1 MG/3DAYS TD PT72
1.0000 | MEDICATED_PATCH | TRANSDERMAL | Status: DC
  Administered 2018-06-29: 1.5 mg via TRANSDERMAL
  Filled 2018-06-29: qty 1

## 2018-06-29 NOTE — Progress Notes (Signed)
   Subjective: 1 Day Post-Op Procedure(s) (LRB): TOTAL KNEE ARTHROPLASTY (Left) Patient reports pain as 8 on 0-10 scale.  Patient complaining of bone foam causing pain behind the knee.  Nausea from yesterday has resolved. Patient is well, and has had no acute complaints or problems Denies any CP, SOB, ABD pain. We will start therapy today.    Objective: Vital signs in last 24 hours: Temp:  [96.5 F (35.8 C)-97.9 F (36.6 C)] 97.9 F (36.6 C) (07/03 0402) Pulse Rate:  [48-71] 64 (07/03 0402) Resp:  [9-19] 18 (07/03 0402) BP: (92-157)/(49-91) 105/53 (07/03 0402) SpO2:  [92 %-100 %] 92 % (07/03 0402)  Intake/Output from previous day: 07/02 0701 - 07/03 0700 In: 3130 [P.O.:630; I.V.:2400; IV Piggyback:100] Out: 1500 [Urine:1350; Drains:100; Blood:50] Intake/Output this shift: No intake/output data recorded.  Recent Labs    06/29/18 0411  HGB 11.7*   Recent Labs    06/29/18 0411  WBC 6.6  RBC 3.89  HCT 34.4*  PLT 178   Recent Labs    06/29/18 0411  NA 138  K 4.0  CL 106  CO2 27  BUN 6*  CREATININE 0.56  GLUCOSE 107*  CALCIUM 8.0*   No results for input(s): LABPT, INR in the last 72 hours.  EXAM General - Patient is Alert, Appropriate and Oriented Extremity - Neurovascular intact Sensation intact distally Intact pulses distally Dorsiflexion/Plantar flexion intact No cellulitis present Compartment soft Dressing - dressing C/D/I and no drainage wound VAC is intact without drainage., Motor Function - intact, moving foot and toes well on exam.   Past Medical History:  Diagnosis Date  . Arthritis   . Dysrhythmia    tachycardia   . Hypertension   . Hyperthyroidism   . Multinodular goiter   . Ovarian cyst July 2008   laparscopic biopsy normal  . Rheumatoid arthritis(714.0)    managed by Beverley Fiedler with MTX    Assessment/Plan:   1 Day Post-Op Procedure(s) (LRB): TOTAL KNEE ARTHROPLASTY (Left) Active Problems:   S/P TKR (total knee replacement)  using cement, left  Estimated body mass index is 31.74 kg/m as calculated from the following:   Height as of this encounter: 5\' 1"  (1.549 m).   Weight as of this encounter: 76.2 kg (168 lb). Advance diet Up with therapy  Needs BM Pain controlled. CM to assist with discharge     DVT Prophylaxis - Aspirin, TED hose and SCD Weight-Bearing as tolerated to left leg   T. , PA-C [facility] 06/29/2018, 8:04 AM

## 2018-06-29 NOTE — Progress Notes (Signed)
Physical Therapy Treatment Patient Details Name: Brooke Tucker MRN: [id number] DOB: 1951-09-30 Today's Date: 06/29/2018    History of Present Illness Pt. is a 67 y.o. female who was admitted for a Left total knee replacement. Pt. PMHx includes: Arthritis, Dysrthmia, HTN, Hyperthyroidism, Multinodular Goiter, Ovarian Cyst, RA    PT Comments    Gradual increase in gait distance with improving quality, gait mechanics; nausea remains barrier to further mobility progression.  Vitals stable and WFL; may consider formal orthostatic assessment next session if persists.    Follow Up Recommendations  Home health PT     Equipment Recommendations  Rolling walker with 5" wheels;3in1 (PT)    Recommendations for Other Services       Precautions / Restrictions Precautions Precautions: Knee;Fall Restrictions Weight Bearing Restrictions: Yes RLE Weight Bearing: Weight bearing as tolerated    Mobility  Bed Mobility   Bed Mobility: Supine to Sit;Sit to Supine     Supine to sit: Modified independent (Device/Increase time) Sit to supine: Modified independent (Device/Increase time)   General bed mobility comments: able to negotiate L LE over egde of bed without assist this date (mild use of momentum)  Transfers Overall transfer level: Needs assistance Equipment used: Rolling walker (2 wheeled) Transfers: Sit to/from Stand Sit to Stand: Min guard         General transfer comment: L LE anterior to BOS, minimal use/WBing L LE   Ambulation/Gait Ambulation/Gait assistance: Min guard;Min assist Gait Distance (Feet): 60 Feet Assistive device: Rolling walker (2 wheeled)       General Gait Details: 3-point, step to pattern, emphasis on increased step height/length bilat; cuing for softer, more controlled step with R LE to increase stance time/weight acceptance L LE   Stairs             Wheelchair Mobility    Modified Rankin (Stroke Patients Only)       Balance Overall  balance assessment: Needs assistance Sitting-balance support: No upper extremity supported;Feet supported Sitting balance-Leahy Scale: Good     Standing balance support: Bilateral upper extremity supported Standing balance-Leahy Scale: Fair                              Cognition Arousal/Alertness: Awake/alert Behavior During Therapy: WFL for tasks assessed/performed Overall Cognitive Status: Within Functional Limits for tasks assessed                                        Exercises Total Joint Exercises Goniometric ROM: L knee: 0-85 degrees Other Exercises Other Exercises: Toilet transfer, SPT with RW, cga/min assist; sit/stand with RW (from [facility]), cga (improved ability with armrests and elevated surface height); standing balance for hygiene, clothing mangement with RW, cga/min assist.    General Comments        Pertinent Vitals/Pain Pain Assessment: Faces Faces Pain Scale: Hurts even more Pain Location: L knee Pain Descriptors / Indicators: Aching Pain Intervention(s): Limited activity within patient's tolerance;Monitored during session;Repositioned;Premedicated before session    Home Living                      Prior Function            PT Goals (current goals can now be found in the care plan section) Acute Rehab PT Goals Patient Stated Goal: To return home PT Goal Formulation:  With patient Time For Goal Achievement: 07/12/18 Potential to Achieve Goals: Good Progress towards PT goals: Progressing toward goals    Frequency    BID      PT Plan Current plan remains appropriate    Co-evaluation              AM-PAC PT "6 Clicks" Daily Activity  Outcome Measure  Difficulty turning over in bed (including adjusting bedclothes, sheets and blankets)?: A Little Difficulty moving from lying on back to sitting on the side of the bed? : A Little Difficulty sitting down on and standing up from a chair with arms (e.g.,  wheelchair, bedside commode, etc,.)?: Unable Help needed moving to and from a bed to chair (including a wheelchair)?: A Little Help needed walking in hospital room?: A Little Help needed climbing 3-5 steps with a railing? : A Lot 6 Click Score: 15    End of Session Equipment Utilized During Treatment: Gait belt Activity Tolerance: Patient tolerated treatment well Patient left: in bed;with bed alarm set;with call bell/phone within reach Nurse Communication: Mobility status PT Visit Diagnosis: Muscle weakness (generalized) (M62.81);Difficulty in walking, not elsewhere classified (R26.2);Pain Pain - Right/Left: Left Pain - part of body: Knee     Time: 1347-1420 PT Time Calculation (min) (ACUTE ONLY): 33 min  Charges:  $Gait Training: 8-22 mins $Therapeutic Exercise: 8-22 mins $Therapeutic Activity: 8-22 mins                    G Codes:      Shernell Saldierna H. Manson Passey, PT, DPT, NCS 06/29/18, 2:30 PM [phone]

## 2018-06-29 NOTE — Progress Notes (Addendum)
Physical Therapy Treatment Patient Details Name: Brooke Tucker MRN: [id number] DOB: April 18, 1951 Today's Date: 06/29/2018    History of Present Illness Pt. is a 67 y.o. female who was admitted for a Left total knee replacement. Pt. PMHx includes: Arthritis, Dysrthmia, HTN, Hyperthyroidism, Multinodular Goiter, Ovarian Cyst, RA    PT Comments    Improved L quad control and activation this AM; able to complete SLR without assist.  Continues to report intermittent nausea (limiting activity tolerance at times), but able to complete gait training (x35') with RW, cga/min assist. Very good effort with all mobility tasks; anticipate consistent progression towards all mobility goals.   Follow Up Recommendations  Home health PT     Equipment Recommendations  Rolling walker with 5" wheels;3in1 (PT)    Recommendations for Other Services       Precautions / Restrictions Precautions Precautions: Knee;Fall Restrictions Weight Bearing Restrictions: Yes RLE Weight Bearing: Weight bearing as tolerated    Mobility  Bed Mobility Overal bed mobility: Modified Independent             General bed mobility comments: able to negotiate L LE over egde of bed without assist this date (mild use of momentum)  Transfers Overall transfer level: Needs assistance Equipment used: Rolling walker (2 wheeled) Transfers: Sit to/from Stand Sit to Stand: Supervision         General transfer comment: prefers to place single UE on bed between legs for support (to mimic the way she performs at baseline)  Ambulation/Gait Ambulation/Gait assistance: Min guard Gait Distance (Feet): 40 Feet Assistive device: Rolling walker (2 wheeled)       General Gait Details: step to gait pattern with decreased stance time/weight acceptance L LE; guarded positioning with limited L heel strike/toe off.  Limited L hip and overall postural extension, maintains slight rotation towards L with mild pelvic retraction on L.   Additional distance limited by nausea.   Stairs             Wheelchair Mobility    Modified Rankin (Stroke Patients Only)       Balance Overall balance assessment: Needs assistance Sitting-balance support: No upper extremity supported;Feet supported Sitting balance-Leahy Scale: Good     Standing balance support: Bilateral upper extremity supported Standing balance-Leahy Scale: Fair                              Cognition Arousal/Alertness: Awake/alert Behavior During Therapy: WFL for tasks assessed/performed Overall Cognitive Status: Within Functional Limits for tasks assessed                                        Exercises Total Joint Exercises Goniometric ROM: L knee: 0-85 degrees Other Exercises Other Exercises: Supine L LE therex, 1x10, act/act assist ROM: ankle pumps, quad sets, SAQs, heel slides, hip abduct/adduct and SLR.  Improved quad control and activation this date; able to complete SLR indep this AM.    General Comments        Pertinent Vitals/Pain Pain Assessment: Faces Pain Score: 7  Faces Pain Scale: Hurts little more Pain Location: L knee Pain Descriptors / Indicators: Aching Pain Intervention(s): Limited activity within patient's tolerance;Monitored during session;Premedicated before session;Repositioned    Home Living Family/patient expects to be discharged to:: Private residence Living Arrangements: Spouse/significant other;Parent Available Help at Discharge: Family   Home Access: Stairs  to enter   Home Layout: One level Home Equipment: Walker - 4 wheels      Prior Function Level of Independence: Independent      Comments: Pt able to drive and be relatively active   PT Goals (current goals can now be found in the care plan section) Acute Rehab PT Goals Patient Stated Goal: To return home PT Goal Formulation: With patient Time For Goal Achievement: 07/12/18 Potential to Achieve Goals: Good Progress  towards PT goals: Progressing toward goals    Frequency    BID      PT Plan Current plan remains appropriate    Co-evaluation              AM-PAC PT "6 Clicks" Daily Activity  Outcome Measure  Difficulty turning over in bed (including adjusting bedclothes, sheets and blankets)?: A Little Difficulty moving from lying on back to sitting on the side of the bed? : A Little Difficulty sitting down on and standing up from a chair with arms (e.g., wheelchair, bedside commode, etc,.)?: Unable Help needed moving to and from a bed to chair (including a wheelchair)?: A Little Help needed walking in hospital room?: A Little Help needed climbing 3-5 steps with a railing? : A Lot 6 Click Score: 15    End of Session Equipment Utilized During Treatment: Gait belt Activity Tolerance: Patient tolerated treatment well Patient left: in chair;with chair alarm set;with call bell/phone within reach;with family/visitor present Nurse Communication: Mobility status PT Visit Diagnosis: Muscle weakness (generalized) (M62.81);Difficulty in walking, not elsewhere classified (R26.2);Pain Pain - Right/Left: Left Pain - part of body: Knee     Time: 5366-4403 PT Time Calculation (min) (ACUTE ONLY): 32 min  Charges:  $Gait Training: 8-22 mins $Therapeutic Exercise: 8-22 mins                    G Codes:       Gaelyn Tukes H. Manson Passey, PT, DPT, NCS 06/29/18, 11:29 AM [phone]

## 2018-06-29 NOTE — Anesthesia Postprocedure Evaluation (Signed)
Anesthesia Post Note  Patient: Brooke Tucker  Procedure(s) Performed: TOTAL KNEE ARTHROPLASTY (Left )  Patient location during evaluation: Nursing Unit Anesthesia Type: Spinal Level of consciousness: oriented and awake and alert Pain management: pain level controlled Vital Signs Assessment: post-procedure vital signs reviewed and stable Respiratory status: spontaneous breathing and respiratory function stable Cardiovascular status: blood pressure returned to baseline and stable Postop Assessment: no headache, no backache, no apparent nausea or vomiting, spinal receding and adequate PO intake Anesthetic complications: no     Last Vitals:  Vitals:   06/28/18 2345 06/29/18 0402  BP: (!) 111/91 (!) 105/53  Pulse: 65 64  Resp: 19 18  Temp: (!) 35.8 C 36.6 C  SpO2: 99% 92%    Last Pain:  Vitals:   06/29/18 0402  TempSrc: Oral  PainSc:                  Jules Schick

## 2018-06-29 NOTE — Progress Notes (Signed)
Clinical Social Worker (CSW) received SNF consult. PT is recommending home health. RN case manager aware of above. Please reconsult if future social work needs arise. CSW signing off.   Amylah Will, LCSW [phone] 

## 2018-06-29 NOTE — Discharge Summary (Signed)
Physician Discharge Summary  Patient ID: Brooke Tucker MRN: [id number] DOB/AGE: 1951-03-23 67 y.o.  Admit date: 06/28/2018 Discharge date: 07/01/2018 Admission Diagnoses:  primary osteoarthritis of left knee   Discharge Diagnoses: Patient Active Problem List   Diagnosis Date Noted  . S/P TKR (total knee replacement) using cement, left 06/28/2018  . Hyperthyroidism 04/26/2018  . Multinodular goiter 04/01/2016  . Tear of LCL (lateral collateral ligament) of knee 10/10/2015  . Lateral meniscus derangement 10/10/2015  . Knee pain, left anterior 09/27/2015  . Vitamin D deficiency 03/16/2015  . Abnormal mammogram with microcalcification 03/19/2014  . Encounter for long-term (current) use of other medications 10/22/2012  . Encounter for preventive health examination 10/22/2012  . Lichen sclerosus et atrophicus 10/22/2012  . Osteoporosis 10/20/2012  . Obesity (BMI 30-39.9) 09/28/2011  . Rheumatoid arthritis (HCC)     Past Medical History:  Diagnosis Date  . Arthritis   . Dysrhythmia    tachycardia   . Hypertension   . Hyperthyroidism   . Multinodular goiter   . Ovarian cyst July 2008   laparscopic biopsy normal  . Rheumatoid arthritis(714.0)    managed by Beverley Fiedler with MTX     Transfusion: noe   Consultants (if any):   Discharged Condition: Improved  Hospital Course: [name] is an 67 y.o. female who was admitted 06/28/2018 with a diagnosis of <principal problem not specified> and went to the operating room on 06/28/2018 and underwent the above named procedures.    Surgeries: Procedure(s): TOTAL KNEE ARTHROPLASTY on 06/28/2018 Patient tolerated the surgery well. Taken to PACU where she was stabilized and then transferred to the orthopedic floor.  Started on aspirin 81 mg BID. Foot pumps applied bilaterally at 80 mm. Heels elevated on bed with rolled towels. No evidence of DVT. Negative Homan. Physical therapy started on day #1 for gait training and transfer. OT  started day #1 for ADL and assisted devices.  Patient's foley was d/c on day #1. Patient's IV was d/c on day #2.  On post op day #3 patient was stable and ready for discharge to home with HHPT.  Implants: Medacta GMK PS 3 femur, 2 tibia left with 12 mm PS insert, 1 patella, all components cemented    She was given perioperative antibiotics:  Anti-infectives (From admission, onward)   Start     Dose/Rate Route Frequency Ordered Stop   06/28/18 1400  ceFAZolin (ANCEF) IVPB 2g/100 mL premix     2 g 200 mL/hr over 30 Minutes Intravenous Every 6 hours 06/28/18 1140 06/28/18 2028   06/28/18 0651  ceFAZolin (ANCEF) 2-4 GM/100ML-% IVPB    Note to Pharmacy:  Renie Ora   : cabinet override      06/28/18 0651 06/28/18 0835   06/27/18 2215  ceFAZolin (ANCEF) IVPB 2g/100 mL premix     2 g 200 mL/hr over 30 Minutes Intravenous  Once 06/27/18 2208 06/28/18 0845    .  She was given sequential compression devices, early ambulation, and aspirin for DVT prophylaxis.  She benefited maximally from the hospital stay and there were no complications.    Recent vital signs:  Vitals:   06/30/18 2358 07/01/18 0740  BP: (!) 101/55 125/82  Pulse: 85 87  Resp:    Temp: 98.2 F (36.8 C) 98.4 F (36.9 C)  SpO2: 93% 92%    Recent laboratory studies:  Lab Results  Component Value Date   HGB 11.7 (L) 06/29/2018   HGB 13.5 06/08/2018   HGB 13.1 09/04/2016  Lab Results  Component Value Date   WBC 6.6 06/29/2018   PLT 178 06/29/2018   Lab Results  Component Value Date   INR 0.89 06/14/2018   Lab Results  Component Value Date   NA 138 06/29/2018   K 4.0 06/29/2018   CL 106 06/29/2018   CO2 27 06/29/2018   BUN 6 (L) 06/29/2018   CREATININE 0.56 06/29/2018   GLUCOSE 107 (H) 06/29/2018    Discharge Medications:   Allergies as of 07/01/2018      Reactions   Naproxen Nausea And Vomiting   Tramadol Nausea And Vomiting   Vicodin [hydrocodone-acetaminophen] Nausea And Vomiting       Medication List    TAKE these medications   acetaminophen 325 MG tablet Commonly known as:  TYLENOL Take 1-2 tablets (325-650 mg total) by mouth every 6 (six) hours as needed for mild pain (pain score 1-3 or temp > 100.5).   aspirin 81 MG chewable tablet Chew 1 tablet (81 mg total) by mouth 2 (two) times daily.   cefdinir 300 MG capsule Commonly known as:  OMNICEF Take 300 mg by mouth 2 (two) times daily.   diclofenac sodium 1 % Gel Commonly known as:  VOLTAREN Apply 1 application topically 4 (four) times daily as needed (joint pain).   diphenhydramine-acetaminophen 25-500 MG Tabs tablet Commonly known as:  TYLENOL PM Take 1 tablet by mouth at bedtime.   docusate sodium 100 MG capsule Commonly known as:  COLACE Take 1 capsule (100 mg total) by mouth 2 (two) times daily.   methimazole 10 MG tablet Commonly known as:  TAPAZOLE Take 1 tablet (10 mg total) by mouth 2 (two) times daily.   methocarbamol 500 MG tablet Commonly known as:  ROBAXIN Take 1 tablet (500 mg total) by mouth every 6 (six) hours as needed for muscle spasms.   metoprolol succinate 25 MG 24 hr tablet Commonly known as:  TOPROL-XL Take 50 mg by mouth every morning.   moxifloxacin 0.5 % ophthalmic solution Commonly known as:  VIGAMOX Place 1 drop into the right eye 3 (three) times daily.   oxyCODONE 5 MG immediate release tablet Commonly known as:  Oxy IR/ROXICODONE Take 1-2 tablets (5-10 mg total) by mouth every 3 (three) hours as needed for moderate pain.   scopolamine 1 MG/3DAYS Commonly known as:  TRANSDERM-SCOP Place 1 patch (1.5 mg total) onto the skin every 3 (three) days. Start taking on:  07/02/2018   sulfacetamide-prednisolone 10-0.2 % ophthalmic suspension Commonly known as:  BLEPHAMIDE Place 1 drop into the right eye 4 (four) times daily.            Durable Medical Equipment  (From admission, onward)        Start     Ordered   06/28/18 1140  DME Walker rolling  Once     Question:  Patient needs a walker to treat with the following condition  Answer:  S/P TKR (total knee replacement) using cement, left   06/28/18 1140   06/28/18 1140  DME 3 n 1  Once     06/28/18 1140   06/28/18 1140  DME Bedside commode  Once    Question:  Patient needs a bedside commode to treat with the following condition  Answer:  S/P TKR (total knee replacement) using cement, left   06/28/18 1140      Diagnostic Studies: Dg Knee 1-2 Views Left  Result Date: 06/28/2018 CLINICAL DATA:  Followup total knee replacement. EXAM: LEFT KNEE -  1-2 VIEW COMPARISON:  MRI 11/16/2015 FINDINGS: S/p total knee arthroplasty. Components appear grossly well positioned. No radiographically detectable complication. IMPRESSION: Good appearance following total knee arthroplasty. Electronically Signed   By: Paulina Fusi M.D.   On: 06/28/2018 10:26    Disposition:     Follow-up Information    Evon Slack, PA-C Follow up in 2 week(s).   Specialties:  Orthopedic Surgery, Emergency Medicine Contact information: [address] [place] [address] [phone]            Signed: Patience Musca 07/01/2018, 8:13 AM

## 2018-06-29 NOTE — Evaluation (Signed)
Occupational Therapy Evaluation Patient Details Name: Brooke Tucker MRN: [id number] DOB: [birth year] Today's Date: 06/29/2018    History of Present Illness Pt. is a 67 y.o. female who was admitted for a Left total knee replacement. Pt. PMHx includes: Arthritis, Dysrthmia, HTN, Hyperthyroidism, Multinodular Goiter, Ovarian Cyst, RA   Clinical Impression   Pt. presents with weakness, nausea, limited activity tolerance, 7/10 pain, and limited functional mobility which limits her ability to complete basic ADL and IADL functioning. Pt. resides at home with her husband. Pt. was independent with ADLs, and IADL functioning: including meal preparation, and medication management. Pt. was able to drive and is retired from, but fills in periodically for the [employer]. Pt. education was provided about A/E use for LE ADLS. Pt. reports having a reacher, and that her daughter purchased a device that she thinks may be a sockaide. Reviewed sockaide use with the pt. Pt. could benefit from OT services for ADL training, A/E training, and pt. education about energy conservation, and work simplification techniques, home modification, and DME. Pt. Plans to return home upon discharge with family (husband and daughter) to assist pt. as needed. Pt. Could benefit from follow-up HHOT services upon discharge.     Follow Up Recommendations  Home health OT    Equipment Recommendations       Recommendations for Other Services       Precautions / Restrictions Precautions Precautions: Knee;Fall Restrictions Weight Bearing Restrictions: Yes RLE Weight Bearing: Weight bearing as tolerated      Mobility Bed Mobility                  Transfers      Deferred secondary to nausea earlier in the a.m. with EOB sitting.                 Balance                                         ADL either performed or assessed with clinical judgement   ADL Overall ADL's : Needs  assistance/impaired Eating/Feeding: Independent;Set up   Grooming: Independent;Set up   Upper Body Bathing: Set up;Independent   Lower Body Bathing: Minimal assistance;Set up   Upper Body Dressing : Set up;Independent   Lower Body Dressing: Moderate assistance;Set up                 General ADL Comments: Pt. education was provided about A/E use for LE ADLs.     Vision Baseline Vision/History: Wears glasses Wears Glasses: At all times Patient Visual Report: No change from baseline       Perception     Praxis      Pertinent Vitals/Pain Pain Assessment: 0-10 Pain Score: 7  Pain Location: R knee Pain Descriptors / Indicators: Aching Pain Intervention(s): Limited activity within patient's tolerance;Monitored during session;Premedicated before session     Hand Dominance Right   Extremity/Trunk Assessment Upper Extremity Assessment Upper Extremity Assessment: Overall WFL for tasks assessed           Communication Communication Communication: No difficulties   Cognition Arousal/Alertness: Awake/alert Behavior During Therapy: WFL for tasks assessed/performed Overall Cognitive Status: Within Functional Limits for tasks assessed  General Comments       Exercises     Shoulder Instructions      Home Living Family/patient expects to be discharged to:: Private residence Living Arrangements: Spouse/significant other;Parent Available Help at Discharge: Family   Home Access: Stairs to enter     Home Layout: One level     Bathroom Shower/Tub: Walk-in shower;Door(Handle on the shower door that pt. holds onto.)         Home Equipment: Environmental consultant - 4 wheels          Prior Functioning/Environment Level of Independence: Independent        Comments: Pt able to drive and be relatively active        OT Problem List: Decreased strength;Decreased activity tolerance      OT Treatment/Interventions:  Self-care/ADL training;Therapeutic exercise;Patient/family education;DME and/or AE instruction    OT Goals(Current goals can be found in the care plan section) Acute Rehab OT Goals Patient Stated Goal: To return home OT Goal Formulation: With patient Potential to Achieve Goals: Good  OT Frequency: Min 2X/week   Barriers to D/C:            Co-evaluation              AM-PAC PT "6 Clicks" Daily Activity     Outcome Measure Help from another person eating meals?: None Help from another person taking care of personal grooming?: None Help from another person toileting, which includes using toliet, bedpan, or urinal?: A Little Help from another person bathing (including washing, rinsing, drying)?: A Little Help from another person to put on and taking off regular upper body clothing?: None Help from another person to put on and taking off regular lower body clothing?: A Little 6 Click Score: 21   End of Session    Activity Tolerance: Patient tolerated treatment well(Nausea in a.m.) Patient left: in bed;with call bell/phone within reach;with bed alarm set;with family/visitor present  OT Visit Diagnosis: Muscle weakness (generalized) (M62.81)                Time: 5427-0623 OT Time Calculation (min): 23 min Charges:  OT General Charges $OT Visit: 1 Visit OT Evaluation $OT Eval Moderate Complexity: 1 Mod G-Codes:    Olegario Messier, MS, OTR/L  Olegario Messier 06/29/2018, 10:05 AM

## 2018-06-30 MED ORDER — GATIFLOXACIN 0.5 % OP SOLN
1.0000 [drp] | Freq: Three times a day (TID) | OPHTHALMIC | Status: DC
  Filled 2018-06-30: qty 2.5

## 2018-06-30 NOTE — Progress Notes (Signed)
Physical Therapy Treatment Patient Details Name: Brooke Tucker MRN: [id number] DOB: 22-Jul-1951 Today's Date: 06/30/2018    History of Present Illness 67 y.o. female s/p Left total knee replacement 06/28/18. Pt. PMHx includes: Arthritis, Dysrthmia, HTN, Hyperthyroidism, Multinodular Goiter, Ovarian Cyst, RA    PT Comments    Pt showed good effort t/o the session despite some continued issues with pain.  She is still struggling with high quality quad sets but is much better than POD0 (SLRs still requiring light AAORM).  She reports that the bone foam has been tough for her, encouraged her to continue using it and being aware of positioning in general as her ROM (flex&ext) is not improving quite as well as expected post-op.  Overall pt remains motivated and eager to work with PT/get back to normal but she does realize that she still has a lot of work ahead of her.    Follow Up Recommendations  Home health PT     Equipment Recommendations  Rolling walker with 5" wheels;3in1 (PT)    Recommendations for Other Services       Precautions / Restrictions Precautions Precautions: Knee;Fall Precaution Booklet Issued: Yes (comment) Restrictions RLE Weight Bearing: Weight bearing as tolerated    Mobility  Bed Mobility Overal bed mobility: Modified Independent Bed Mobility: Supine to Sit     Supine to sit: Modified independent (Device/Increase time)        Transfers Overall transfer level: Modified independent Equipment used: Rolling walker (2 wheeled) Transfers: Sit to/from Stand Sit to Stand: Min guard         General transfer comment: cuing for set up and sequencing, but able to rise and sit with control and safety  Ambulation/Gait Ambulation/Gait assistance: Min guard Gait Distance (Feet): 105 Feet Assistive device: Rolling walker (2 wheeled)       General Gait Details: Pt continues to have somewhat hesitant and slow ambulation, but was safe, relatively consistent and  despite some fatigue was able to achieve >100 ft for the first time today   Stairs             Wheelchair Mobility    Modified Rankin (Stroke Patients Only)       Balance Overall balance assessment: Modified Independent                                          Cognition Arousal/Alertness: Awake/alert Behavior During Therapy: WFL for tasks assessed/performed Overall Cognitive Status: Within Functional Limits for tasks assessed                                        Exercises Total Joint Exercises Ankle Circles/Pumps: AROM;10 reps Quad Sets: Strengthening;15 reps Gluteal Sets: Strengthening;15 reps Short Arc Quad: 10 reps;AAROM;AROM Heel Slides: AAROM;10 reps Hip ABduction/ADduction: Strengthening;10 reps Straight Leg Raises: AAROM;10 reps Knee Flexion: PROM;5 reps Goniometric ROM: 2-71    General Comments        Pertinent Vitals/Pain Pain Assessment: 0-10 Pain Score: 6     Home Living                      Prior Function            PT Goals (current goals can now be found in the care plan section) Acute Rehab PT  Goals Patient Stated Goal: To return home Progress towards PT goals: Progressing toward goals    Frequency    BID      PT Plan Current plan remains appropriate    Co-evaluation              AM-PAC PT "6 Clicks" Daily Activity  Outcome Measure  Difficulty turning over in bed (including adjusting bedclothes, sheets and blankets)?: A Little Difficulty moving from lying on back to sitting on the side of the bed? : A Little Difficulty sitting down on and standing up from a chair with arms (e.g., wheelchair, bedside commode, etc,.)?: A Little Help needed moving to and from a bed to chair (including a wheelchair)?: A Little Help needed walking in hospital room?: A Little Help needed climbing 3-5 steps with a railing? : A Lot 6 Click Score: 17    End of Session Equipment Utilized During  Treatment: Gait belt Activity Tolerance: Patient tolerated treatment well Patient left: with chair alarm set;with call bell/phone within reach   PT Visit Diagnosis: Muscle weakness (generalized) (M62.81);Difficulty in walking, not elsewhere classified (R26.2);Pain Pain - Right/Left: Left Pain - part of body: Knee     Time: 3810-1751 PT Time Calculation (min) (ACUTE ONLY): 41 min  Charges:  $Gait Training: 8-22 mins $Therapeutic Exercise: 8-22 mins $Therapeutic Activity: 8-22 mins                    G Codes:       Malachi Pro, DPT 06/30/2018, 1:26 PM

## 2018-06-30 NOTE — Progress Notes (Signed)
Subjective: 2 Days Post-Op Procedure(s) (LRB): TOTAL KNEE ARTHROPLASTY (Left) Patient reports pain as 6 on 0-10 scale.   Improved nausea. Patient is well, and has had no acute complaints or problems Denies any CP, SOB, ABD pain. Continue therapy today.  Was able to walk 60 feet yesterday.  Objective: Vital signs in last 24 hours: Temp:  [98.1 F (36.7 C)-99.4 F (37.4 C)] 98.6 F (37 C) (07/04 0924) Pulse Rate:  [71-85] 85 (07/04 0924) Resp:  [18-20] 18 (07/04 0924) BP: (117-157)/(63-90) 133/83 (07/04 0924) SpO2:  [94 %-98 %] 97 % (07/04 0924)  Intake/Output from previous day: 07/03 0701 - 07/04 0700 In: 600 [P.O.:600] Out: 500 [Urine:500] Intake/Output this shift: Total I/O In: 240 [P.O.:240] Out: 450 [Urine:450]  Recent Labs    06/29/18 0411  HGB 11.7*   Recent Labs    06/29/18 0411  WBC 6.6  RBC 3.89  HCT 34.4*  PLT 178   Recent Labs    06/29/18 0411  NA 138  K 4.0  CL 106  CO2 27  BUN 6*  CREATININE 0.56  GLUCOSE 107*  CALCIUM 8.0*   No results for input(s): LABPT, INR in the last 72 hours.  EXAM General - Patient is Alert, Appropriate and Oriented Extremity - Neurovascular intact Sensation intact distally Intact pulses distally Dorsiflexion/Plantar flexion intact No cellulitis present Compartment soft Dressing - dressing C/D/I and no drainage wound VAC is intact without drainage., Motor Function - intact, moving foot and toes well on exam.   Past Medical History:  Diagnosis Date  . Arthritis   . Dysrhythmia    tachycardia   . Hypertension   . Hyperthyroidism   . Multinodular goiter   . Ovarian cyst July 2008   laparscopic biopsy normal  . Rheumatoid arthritis(714.0)    managed by Beverley Fiedler with MTX    Assessment/Plan:   2 Days Post-Op Procedure(s) (LRB): TOTAL KNEE ARTHROPLASTY (Left) Active Problems:   S/P TKR (total knee replacement) using cement, left  Estimated body mass index is 31.74 kg/m as calculated from the  following:   Height as of this encounter: 5\' 1"  (1.549 m).   Weight as of this encounter: 76.2 kg (168 lb). Advance diet Up with therapy   Continue PT, able to walk 60 feet yesterday. Mild fever last night, WBC 6.6 Pt is passing gas will continue to work on BM. Pain is better controlled this morning. Plan will be for discharge home tomorrow with HHPT.  DVT Prophylaxis - Aspirin, TED hose and SCD Weight-Bearing as tolerated to left leg  J. , PA-C [facility] 06/30/2018, 9:32 AM

## 2018-06-30 NOTE — Care Management Note (Signed)
Case Management Note  Patient Details  Name: Brooke Tucker MRN: [id number] Date of Birth: [birth year]  Subjective/Objective:   POD # 2 left total knee arthroplasty. Met with patient at bedside.She lives at home with her spouse and 67 year old mother who remains very independent. Patient has a bsc but will need a walker. Ordered from [place] with [facility]. Provided a list of home care agencies. Referral to [facility] or [place] [facility] [place]. It is anticipated patient will discharge tomorrow 7/5. PCP is Dr. Derrel Nip. Patient will discharge home on ASA                   Action/Plan: [facility] for PT/OT, Walker from [facility], [facility] for anticoagulant.   Expected Discharge Date:                  Expected Discharge Plan:  [place]  In-House Referral:     Discharge planning Services  CM Consult  Post Acute Care Choice:  Durable Medical Equipment, Home Health Choice offered to:  Patient  DME Arranged:  Walker rolling DME Agency:  [place] Arranged:  PT, OT [place] Agency:  [facility]  Status of Service:  In process, will continue to follow  If discussed at Long Length of Stay Meetings, dates discussed:    Additional Comments:  Jolly Mango, RN 06/30/2018, 9:52 AM

## 2018-06-30 NOTE — Progress Notes (Signed)
OT Cancellation Note  Patient Details Name: Brooke Tucker MRN: [id number] DOB: January 19, 1951   Cancelled Treatment:    Reason Eval/Treat Not Completed: Other (comment). Upon attempt to treat, pt ambulating with RW and CNA to bathroom. Will re-attempt next date.   Richrd Prime, MPH, MS, OTR/L ascom [phone] 06/30/18, 4:38 PM

## 2018-07-01 MED ORDER — SCOPOLAMINE 1 MG/3DAYS TD PT72: 1.0000 | MEDICATED_PATCH | TRANSDERMAL | 0 refills | Status: DC

## 2018-07-01 NOTE — Progress Notes (Signed)
   Subjective: 3 Days Post-Op Procedure(s) (LRB): TOTAL KNEE ARTHROPLASTY (Left) Patient reports pain as mild.  Nausea resolved with scopoplamine patch Patient is well, and has had no acute complaints or problems Denies any CP, SOB, ABD pain. We will continue therapy today.  Plan is to go Home after hospital stay.  Objective: Vital signs in last 24 hours: Temp:  [98.2 F (36.8 C)-98.6 F (37 C)] 98.4 F (36.9 C) (07/05 0740) Pulse Rate:  [73-87] 87 (07/05 0740) Resp:  [18] 18 (07/04 1558) BP: (101-133)/(55-83) 125/82 (07/05 0740) SpO2:  [92 %-97 %] 92 % (07/05 0740)  Intake/Output from previous day: 07/04 0701 - 07/05 0700 In: 720 [P.O.:720] Out: 450 [Urine:450] Intake/Output this shift: No intake/output data recorded.  Recent Labs    06/29/18 0411  HGB 11.7*   Recent Labs    06/29/18 0411  WBC 6.6  RBC 3.89  HCT 34.4*  PLT 178   Recent Labs    06/29/18 0411  NA 138  K 4.0  CL 106  CO2 27  BUN 6*  CREATININE 0.56  GLUCOSE 107*  CALCIUM 8.0*   No results for input(s): LABPT, INR in the last 72 hours.  EXAM General - Patient is Alert, Appropriate and Oriented Extremity - Neurovascular intact Sensation intact distally Intact pulses distally Dorsiflexion/Plantar flexion intact No cellulitis present Compartment soft Dressing - dressing C/D/I and no drainage, wound VAC intact  motor Function - intact, moving foot and toes well on exam.   Past Medical History:  Diagnosis Date  . Arthritis   . Dysrhythmia    tachycardia   . Hypertension   . Hyperthyroidism   . Multinodular goiter   . Ovarian cyst July 2008   laparscopic biopsy normal  . Rheumatoid arthritis(714.0)    managed by Beverley Fiedler with MTX    Assessment/Plan:   3 Days Post-Op Procedure(s) (LRB): TOTAL KNEE ARTHROPLASTY (Left) Active Problems:   S/P TKR (total knee replacement) using cement, left  Estimated body mass index is 31.74 kg/m as calculated from the following:   Height  as of this encounter: 5\' 1"  (1.549 m).   Weight as of this encounter: 76.2 kg (168 lb). Advance diet Up with therapy  Discharge home with home health PT today Compression stockings bilateral lower extremities.  DVT Prophylaxis - Aspirin, TED hose and SCD Weight-Bearing as tolerated to left leg   T. , PA-C [facility] 07/01/2018, 8:01 AM

## 2018-07-01 NOTE — Care Management Important Message (Signed)
Important Message  Patient Details  Name: Brooke Tucker MRN: [id number] Date of Birth: August 01, 1951   Medicare Important Message Given:  Yes    Olegario Messier A [name] 07/01/2018, 10:50 AM

## 2018-07-01 NOTE — Discharge Instructions (Signed)
° °TOTAL KNEE REPLACEMENT POSTOPERATIVE DIRECTIONS ° °Knee Rehabilitation, Guidelines Following Surgery  °Results after knee surgery are often greatly improved when you follow the exercise, range of motion and muscle strengthening exercises prescribed by your doctor. Safety measures are also important to protect the knee from further injury. Any time any of these exercises cause you to have increased pain or swelling in your knee joint, decrease the amount until you are comfortable again and slowly increase them. If you have problems or questions, call your caregiver or physical therapist for advice.  ° °HOME CARE INSTRUCTIONS  °Remove items at home which could result in a fall. This includes throw rugs or furniture in walking pathways.  °· Continue to use polar care unit on the knee for pain and swelling from surgery. You may notice swelling that will progress down to the foot and ankle.  This is normal after surgery.  Elevate the leg when you are not up walking on it.   °· Continue to use the breathing machine which will help keep your temperature down.  It is common for your temperature to cycle up and down following surgery, especially at night when you are not up moving around and exerting yourself.  The breathing machine keeps your lungs expanded and your temperature down. °· Do not place pillow under knee, focus on keeping the knee STRAIGHT while resting ° °DIET °You may resume your previous home diet once your are discharged from the hospital. ° °DRESSING / WOUND CARE / SHOWERING °Please remove provena negative pressure dressing on 07/08/2018 and apply honey comb dressing. Keep dressing clean and dry at all times. ° °ACTIVITY °Walk with your walker as instructed. °Use walker as long as suggested by your caregivers. °Avoid periods of inactivity such as sitting longer than an hour when not asleep. This helps prevent blood clots.  °You may resume a sexual relationship in one month or when given the OK by your  doctor.  °You may return to work once you are cleared by your doctor.  °Do not drive a car for 6 weeks or until released by you surgeon.  °Do not drive while taking narcotics. ° °WEIGHT BEARING °Weight bearing as tolerated with assist device (walker, cane, etc) as directed, use it as long as suggested by your surgeon or therapist, typically at least 4-6 weeks. ° °POSTOPERATIVE CONSTIPATION PROTOCOL °Constipation - defined medically as fewer than three stools per week and severe constipation as less than one stool per week. ° °One of the most common issues patients have following surgery is constipation.  Even if you have a regular bowel pattern at home, your normal regimen is likely to be disrupted due to multiple reasons following surgery.  Combination of anesthesia, postoperative narcotics, change in appetite and fluid intake all can affect your bowels.  In order to avoid complications following surgery, here are some recommendations in order to help you during your recovery period. ° °Colace (docusate) - Pick up an over-the-counter form of Colace or another stool softener and take twice a day as long as you are requiring postoperative pain medications.  Take with a full glass of water daily.  If you experience loose stools or diarrhea, hold the colace until you stool forms back up.  If your symptoms do not get better within 1 week or if they get worse, check with your doctor. ° °Dulcolax (bisacodyl) - Pick up over-the-counter and take as directed by the product packaging as needed to assist with the movement of   your bowels.  Take with a full glass of water.  Use this product as needed if not relieved by Colace only.  ° °MiraLax (polyethylene glycol) - Pick up over-the-counter to have on hand.  MiraLax is a solution that will increase the amount of water in your bowels to assist with bowel movements.  Take as directed and can mix with a glass of water, juice, soda, coffee, or tea.  Take if you go more than two  days without a movement. °Do not use MiraLax more than once per day. Call your doctor if you are still constipated or irregular after using this medication for 7 days in a row. ° °If you continue to have problems with postoperative constipation, please contact the office for further assistance and recommendations.  If you experience "the worst abdominal pain ever" or develop nausea or vomiting, please contact the office immediatly for further recommendations for treatment. ° °ITCHING ° If you experience itching with your medications, try taking only a single pain pill, or even half a pain pill at a time.  You can also use Benadryl over the counter for itching or also to help with sleep.  ° °TED HOSE STOCKINGS °Wear the elastic stockings on both legs for six weeks following surgery during the day but you may remove then at night for sleeping. ° °MEDICATIONS °See your medication summary on the “After Visit Summary” that the nursing staff will review with you prior to discharge.  You may have some home medications which will be placed on hold until you complete the course of blood thinner medication.  It is important for you to complete the blood thinner medication as prescribed by your surgeon.  Continue your approved medications as instructed at time of discharge. ° °PRECAUTIONS °If you experience chest pain or shortness of breath - call 911 immediately for transfer to the hospital emergency department.  °If you develop a fever greater that 101 F, purulent drainage from wound, increased redness or drainage from wound, foul odor from the wound/dressing, or calf pain - CONTACT YOUR SURGEON.   °                                                °FOLLOW-UP APPOINTMENTS °Make sure you keep all of your appointments after your operation with your surgeon and caregivers. You should call the office at the above phone number and make an appointment for approximately two weeks after the date of your surgery or on the date  instructed by your surgeon outlined in the "After Visit Summary". ° ° °RANGE OF MOTION AND STRENGTHENING EXERCISES  °Rehabilitation of the knee is important following a knee injury or an operation. After just a few days of immobilization, the muscles of the thigh which control the knee become weakened and shrink (atrophy). Knee exercises are designed to build up the tone and strength of the thigh muscles and to improve knee motion. Often times heat used for twenty to thirty minutes before working out will loosen up your tissues and help with improving the range of motion but do not use heat for the first two weeks following surgery. These exercises can be done on a training (exercise) mat, on the floor, on a table or on a bed. Use what ever works the best and is most comfortable for you Knee exercises include:  °Leg Lifts -   While your knee is still immobilized in a splint or cast, you can do straight leg raises. Lift the leg to 60 degrees, hold for 3 sec, and slowly lower the leg. Repeat 10-20 times 2-3 times daily. Perform this exercise against resistance later as your knee gets better.  °Quad and Hamstring Sets - Tighten up the muscle on the front of the thigh (Quad) and hold for 5-10 sec. Repeat this 10-20 times hourly. Hamstring sets are done by pushing the foot backward against an object and holding for 5-10 sec. Repeat as with quad sets.  °· Leg Slides: Lying on your back, slowly slide your foot toward your buttocks, bending your knee up off the floor (only go as far as is comfortable). Then slowly slide your foot back down until your leg is flat on the floor again. °· Angel Wings: Lying on your back spread your legs to the side as far apart as you can without causing discomfort.  °A rehabilitation program following serious knee injuries can speed recovery and prevent re-injury in the future due to weakened muscles. Contact your doctor or a physical therapist for more information on knee rehabilitation.  ° °IF  YOU ARE TRANSFERRED TO A SKILLED REHAB FACILITY °If the patient is transferred to a skilled rehab facility following release from the hospital, a list of the current medications will be sent to the facility for the patient to continue.  When discharged from the skilled rehab facility, please have the facility set up the patient's Home Health Physical Therapy prior to being released. Also, the skilled facility will be responsible for providing the patient with their medications at time of release from the facility to include their pain medication, the muscle relaxants, and their blood thinner medication. If the patient is still at the rehab facility at time of the two week follow up appointment, the skilled rehab facility will also need to assist the patient in arranging follow up appointment in our office and any transportation needs. ° °MAKE SURE YOU:  °Understand these instructions.  °Get help right away if you are not doing well or get worse.  ° ° ° ° ° °

## 2018-07-01 NOTE — Progress Notes (Signed)
Patient is being discharged home with husband. Reviewed discharge instructions, scripts, and meds. Dressing care, wound vac switched over to prevena. S&S of clot and infection discussed. Allowed time for questions. IV removed with cath intact.

## 2018-07-01 NOTE — Progress Notes (Signed)
Physical Therapy Treatment Patient Details Name: Brooke Tucker MRN: [id number] DOB: November 27, 1951 Today's Date: 07/01/2018    History of Present Illness 67 y.o. female s/p Left total knee replacement 06/28/18. Pt. PMHx includes: Arthritis, Dysrthmia, HTN, Hyperthyroidism, Multinodular Goiter, Ovarian Cyst, RA    PT Comments    Pt had her best PT session by far today with improved gait speed and confidence with ambulation, ability to negotiate up/down step, much improved ROM and generally feeling better.  Pt eager to return home and will be safe to do so.     Follow Up Recommendations  Home health PT     Equipment Recommendations  Rolling walker with 5" wheels;3in1 (PT)    Recommendations for Other Services       Precautions / Restrictions Precautions Precautions: Knee;Fall Restrictions Weight Bearing Restrictions: Yes RLE Weight Bearing: Weight bearing as tolerated    Mobility  Bed Mobility Overal bed mobility: Modified Independent Bed Mobility: Supine to Sit     Supine to sit: Modified independent (Device/Increase time)        Transfers Overall transfer level: Modified independent Equipment used: Rolling walker (2 wheeled) Transfers: Sit to/from Stand Sit to Stand: Min guard         General transfer comment: Pt needed only mild cuing for sit to stand, safe and confident with transition  Ambulation/Gait Ambulation/Gait assistance: Min guard Gait Distance (Feet): 250 Feet Assistive device: Rolling walker (2 wheeled)       General Gait Details: Pt did well with prolonged ambulation today and had much more confidence (as well as gait speed and decreased walker reliance)    Stairs Stairs: Yes Stairs assistance: Min guard Stair Management: No rails;Forwards;With walker Number of Stairs: 1 General stair comments: Pt able to negotiate up/down single step w/o rails to simulate home enterance   Wheelchair Mobility    Modified Rankin (Stroke Patients Only)        Balance Overall balance assessment: Modified Independent Sitting-balance support: No upper extremity supported;Feet supported Sitting balance-Leahy Scale: Good     Standing balance support: Bilateral upper extremity supported Standing balance-Leahy Scale: Good                              Cognition Arousal/Alertness: Awake/alert Behavior During Therapy: WFL for tasks assessed/performed Overall Cognitive Status: Within Functional Limits for tasks assessed                                        Exercises Total Joint Exercises Ankle Circles/Pumps: AROM;10 reps Quad Sets: Strengthening;15 reps Gluteal Sets: Strengthening;15 reps Heel Slides: Strengthening;AROM;10 reps Hip ABduction/ADduction: Strengthening;10 reps Straight Leg Raises: AAROM;10 reps Long Arc Quad: AROM;10 reps Knee Flexion: PROM;5 reps Goniometric ROM: 0-89    General Comments        Pertinent Vitals/Pain Pain Score: 5     Home Living                      Prior Function            PT Goals (current goals can now be found in the care plan section) Progress towards PT goals: Progressing toward goals    Frequency    BID      PT Plan Current plan remains appropriate    Co-evaluation  AM-PAC PT "6 Clicks" Daily Activity  Outcome Measure  Difficulty turning over in bed (including adjusting bedclothes, sheets and blankets)?: None Difficulty moving from lying on back to sitting on the side of the bed? : None Difficulty sitting down on and standing up from a chair with arms (e.g., wheelchair, bedside commode, etc,.)?: None Help needed moving to and from a bed to chair (including a wheelchair)?: None Help needed walking in hospital room?: A Little Help needed climbing 3-5 steps with a railing? : A Little 6 Click Score: 22    End of Session Equipment Utilized During Treatment: Gait belt Activity Tolerance: Patient tolerated treatment  well Patient left: with chair alarm set;with call bell/phone within reach Nurse Communication: Mobility status PT Visit Diagnosis: Muscle weakness (generalized) (M62.81);Difficulty in walking, not elsewhere classified (R26.2);Pain Pain - Right/Left: Left Pain - part of body: Knee     Time: 1937-9024 PT Time Calculation (min) (ACUTE ONLY): 40 min  Charges:  $Gait Training: 8-22 mins $Therapeutic Exercise: 23-37 mins                    G Codes:       Malachi Pro, DPT 07/01/2018, 12:05 PM

## 2018-07-01 NOTE — Care Management Note (Addendum)
Case Management Note  Patient Details  Name: Brooke Tucker MRN: [id number] Date of Birth: [birth year]  Subjective/Objective:  Patient is to be discharged per MD order. Orders in place for HHPT and OT services at discharge. Previous RNCM has worked patient up for services via [facility]. I have confirmed this with the patient. Referral placed with Rosey Bath from [facility] who accepts the case. Patient had walker delivered via [facility]. No other RNCM needs. Family to provide transport. Buddy Duty RN BSN RNCM [phone]                    Action/Plan:   Expected Discharge Date:  07/01/18               Expected Discharge Plan:  Home w Home Health Services  In-House Referral:     Discharge planning Services  CM Consult  Post Acute Care Choice:  Durable Medical Equipment, Home Health Choice offered to:  Patient  DME Arranged:  Walker rolling DME Agency:  [facility]  HH Arranged:  PT, OT HH Agency:  [facility] ([facility]  Status of Service:  In process, will continue to follow  If discussed at Long Length of Stay Meetings, dates discussed:    Additional Comments:  Virgel Manifold, RN 07/01/2018, 9:17 AM

## 2018-07-15 IMAGING — DX DG KNEE 1-2V*L*
2 series · 2 of 2 positions shown · non-contrast
Comparison: MRI 11/16/2015

CLINICAL DATA: Followup total knee replacement.

EXAM:
LEFT KNEE - 1-2 VIEW

[knee ap]
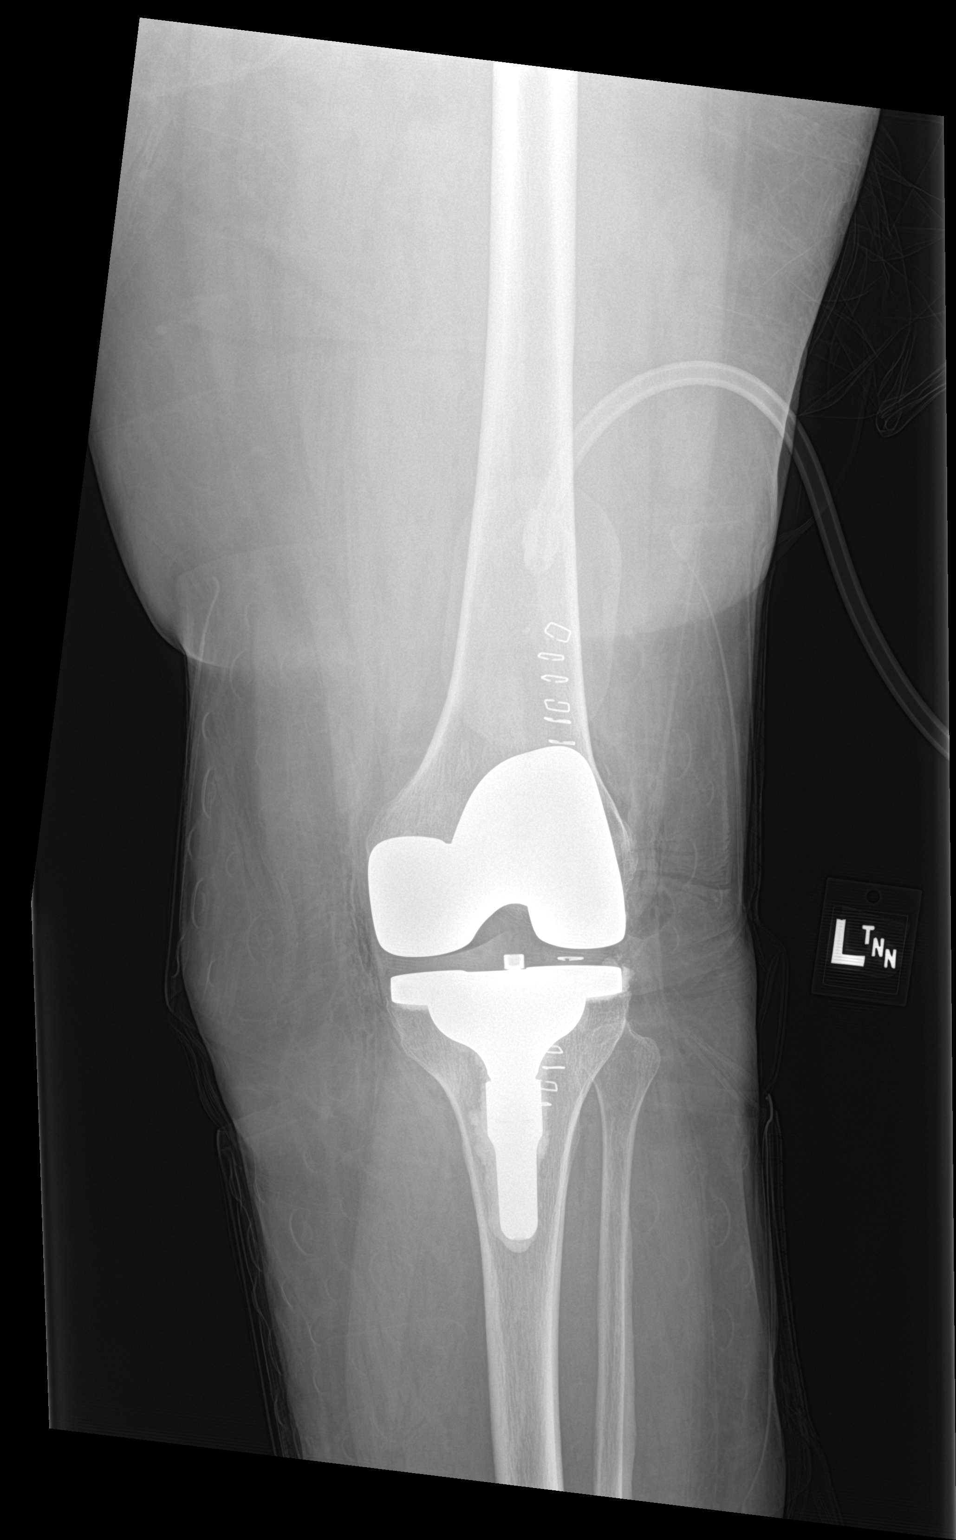

[knee lat]
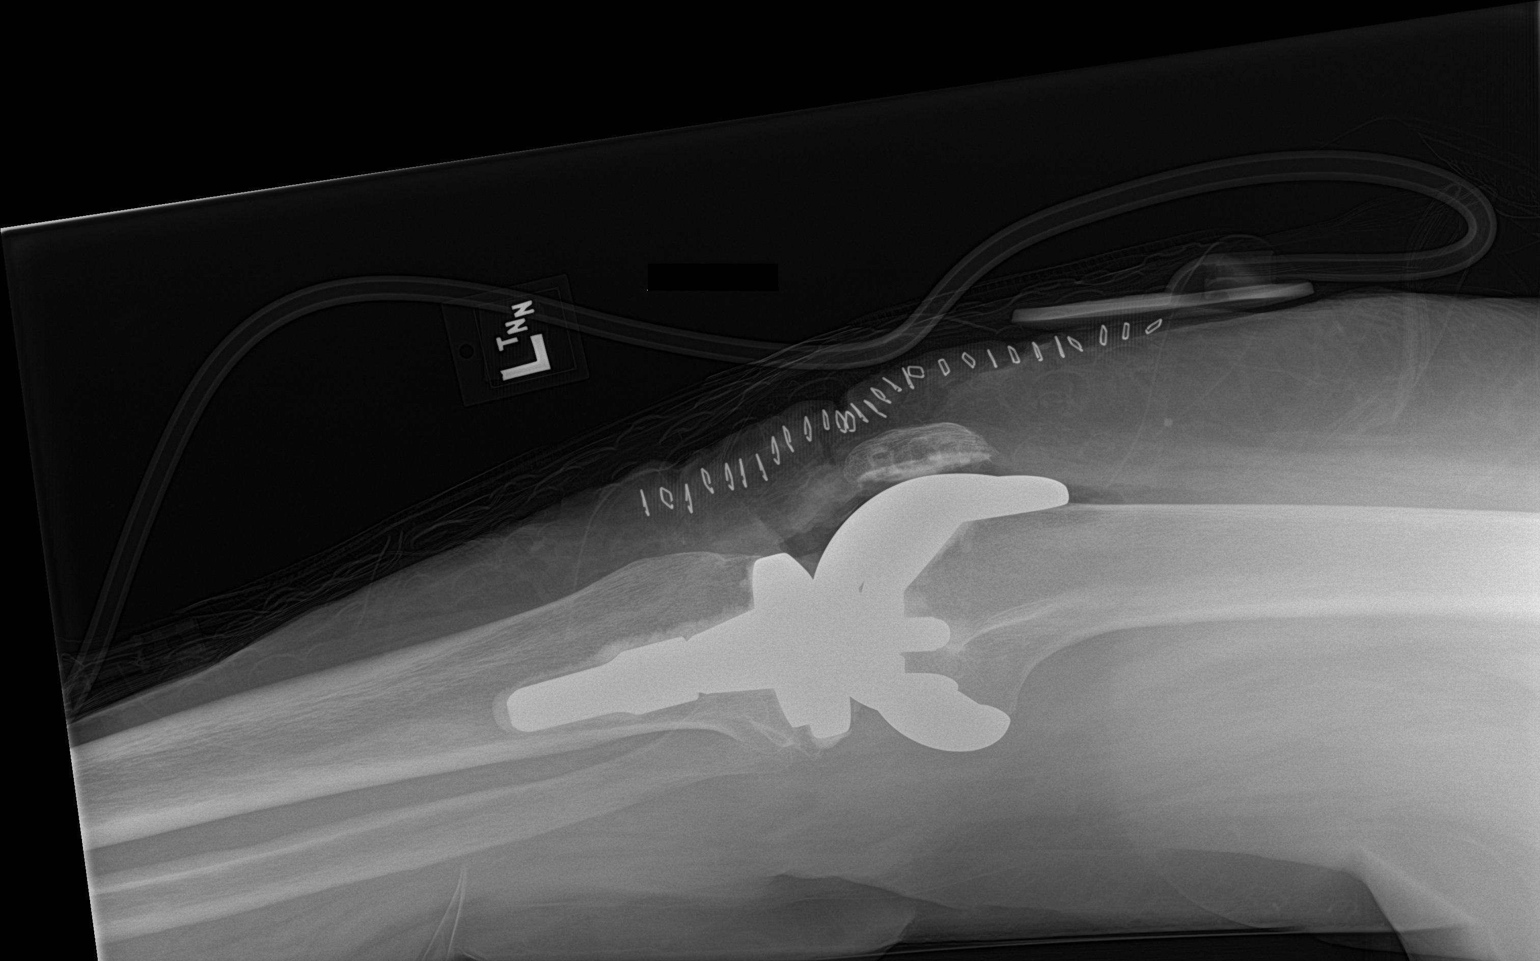

[2 of 2 positions shown; findings below may reference images not displayed]

FINDINGS: S/p total knee arthroplasty. Components appear grossly well
positioned. No radiographically detectable complication.
IMPRESSION: Good appearance following total knee arthroplasty.

## 2018-07-28 ENCOUNTER — Encounter: Payer: Self-pay | Admitting: Endocrinology

## 2018-07-30 ENCOUNTER — Other Ambulatory Visit: Payer: Self-pay | Admitting: Endocrinology

## 2018-07-30 DIAGNOSIS — E0590 Thyrotoxicosis, unspecified without thyrotoxic crisis or storm: Secondary | ICD-10-CM

## 2018-08-01 ENCOUNTER — Other Ambulatory Visit (INDEPENDENT_AMBULATORY_CARE_PROVIDER_SITE_OTHER): Payer: Medicare Other

## 2018-08-01 DIAGNOSIS — E0590 Thyrotoxicosis, unspecified without thyrotoxic crisis or storm: Secondary | ICD-10-CM

## 2018-08-01 LAB — TSH: TSH: 0.96 u[IU]/mL (ref 0.35–4.50)

## 2018-08-01 LAB — T4, FREE: Free T4: 1.04 ng/dL (ref 0.60–1.60)

## 2018-08-17 ENCOUNTER — Telehealth: Payer: Self-pay | Admitting: Internal Medicine

## 2018-08-17 ENCOUNTER — Other Ambulatory Visit: Payer: Self-pay

## 2018-08-17 DIAGNOSIS — Z1211 Encounter for screening for malignant neoplasm of colon: Secondary | ICD-10-CM

## 2018-08-17 NOTE — Telephone Encounter (Signed)
Order has been placed in quick sign folder for signature.  ?

## 2018-08-17 NOTE — Telephone Encounter (Signed)
Pt was in office and stated she never received her colorguard kit. She would like one, please.

## 2018-10-04 ENCOUNTER — Ambulatory Visit (INDEPENDENT_AMBULATORY_CARE_PROVIDER_SITE_OTHER): Payer: Medicare Other | Admitting: Endocrinology

## 2018-10-04 ENCOUNTER — Encounter: Payer: Self-pay | Admitting: Endocrinology

## 2018-10-04 VITALS — BP 124/80 | HR 83 | Ht 61.0 in | Wt 169.4 lb

## 2018-10-04 DIAGNOSIS — E0590 Thyrotoxicosis, unspecified without thyrotoxic crisis or storm: Secondary | ICD-10-CM

## 2018-10-04 LAB — TSH: TSH: 0.03 u[IU]/mL — AB (ref 0.35–4.50)

## 2018-10-04 LAB — T4, FREE: Free T4: 1.58 ng/dL (ref 0.60–1.60)

## 2018-10-04 NOTE — Patient Instructions (Addendum)
blood tests are requested for you today.  We'll let you know about the results. If ever you have fever while taking methimazole, stop it and call us, even if the reason is obvious, because of the risk of a rare side-effect. Please come back for a follow-up appointment in 3-4 months.   

## 2018-10-04 NOTE — Progress Notes (Signed)
Subjective:    Patient ID: Brooke Tucker, female    DOB: 11-Feb-1951, 67 y.o.   MRN: [id number]  HPI Pt returns for f/u of hyperthyroidism (she was dx'ed with a multinodular goiter in 2008, and hyperthyroidism in early 2019; she had RAI on 04/14/18; tapazole was rx'ed while RAI is working, but was stopped in 7/19).  pt states she feels well in general, except for palpitations.   Past Medical History:  Diagnosis Date  . Arthritis   . Dysrhythmia    tachycardia   . Hypertension   . Hyperthyroidism   . Multinodular goiter   . Ovarian cyst July 2008   laparscopic biopsy normal  . Rheumatoid arthritis(714.0)    managed by Beverley Fiedler with MTX    Past Surgical History:  Procedure Laterality Date  . COMBINED HYSTEROSCOPY DIAGNOSTIC / D&C  July 2008   [place]  . ESOPHAGOGASTRODUODENOSCOPY  July 2008   normal  . EXCISION MORTON'S NEUROMA     Left foot  . KNEE ARTHROSCOPY Left 01/07/2016   Procedure: ARTHROSCOPY KNEE, lateral release, partial lateral menisectomy, excision plica;  Surgeon: Kennedy Bucker, MD;  Location: [facility] ORS;  Service: Orthopedics;  Laterality: Left;  . LEFT HEART CATH AND CORONARY ANGIOGRAPHY Left 02/03/2018   Procedure: LEFT HEART CATH AND CORONARY ANGIOGRAPHY;  Surgeon: Marcina Millard, MD;  Location: [facility] INVASIVE CV LAB;  Service: Cardiovascular;  Laterality: Left;  . METATARSAL OSTEOTOMY  dec 2012   right foot, 5th MT  ([place])  . TOTAL KNEE ARTHROPLASTY Left 06/28/2018   Procedure: TOTAL KNEE ARTHROPLASTY;  Surgeon: Kennedy Bucker, MD;  Location: [facility] ORS;  Service: Orthopedics;  Laterality: Left;    Social History   Socioeconomic History  . Marital status: Married    Spouse name: Not on file  . Number of children: Not on file  . Years of education: Not on file  . Highest education level: Not on file  Occupational History  . Not on file  Social Needs  . Financial resource strain: Not on file  . Food insecurity:    Worry: Not on file    Inability: Not  on file  . Transportation needs:    Medical: Not on file    Non-medical: Not on file  Tobacco Use  . Smoking status: Never Smoker  . Smokeless tobacco: Never Used  Substance and Sexual Activity  . Alcohol use: Yes    Comment: occasional  . Drug use: No  . Sexual activity: Not Currently  Lifestyle  . Physical activity:    Days per week: Not on file    Minutes per session: Not on file  . Stress: Not on file  Relationships  . Social connections:    Talks on phone: Not on file    Gets together: Not on file    Attends religious service: Not on file    Active member of club or organization: Not on file    Attends meetings of clubs or organizations: Not on file    Relationship status: Not on file  . Intimate partner violence:    Fear of current or ex partner: Not on file    Emotionally abused: Not on file    Physically abused: Not on file    Forced sexual activity: Not on file  Other Topics Concern  . Not on file  Social History Narrative  . Not on file    Current Outpatient Medications on File Prior to Visit  Medication Sig Dispense Refill  . metoprolol  succinate (TOPROL-XL) 25 MG 24 hr tablet Take 50 mg by mouth every morning.     Marland Kitchen acetaminophen (TYLENOL) 325 MG tablet Take 1-2 tablets (325-650 mg total) by mouth every 6 (six) hours as needed for mild pain (pain score 1-3 or temp > 100.5). (Patient not taking: Reported on 10/04/2018)    . aspirin 81 MG chewable tablet Chew 1 tablet (81 mg total) by mouth 2 (two) times daily. (Patient not taking: Reported on 10/04/2018) 30 tablet 0  . diphenhydramine-acetaminophen (TYLENOL PM) 25-500 MG TABS tablet Take 1 tablet by mouth at bedtime.    . docusate sodium (COLACE) 100 MG capsule Take 1 capsule (100 mg total) by mouth 2 (two) times daily. (Patient not taking: Reported on 10/04/2018) 10 capsule 0  . methocarbamol (ROBAXIN) 500 MG tablet Take 1 tablet (500 mg total) by mouth every 6 (six) hours as needed for muscle spasms. (Patient not  taking: Reported on 10/04/2018) 20 tablet 0  . moxifloxacin (VIGAMOX) 0.5 % ophthalmic solution Place 1 drop into the right eye 3 (three) times daily.    Marland Kitchen scopolamine (TRANSDERM-SCOP) 1 MG/3DAYS Place 1 patch (1.5 mg total) onto the skin every 3 (three) days. (Patient not taking: Reported on 10/04/2018) 10 patch 0  . sulfacetamide-prednisolone (BLEPHAMIDE) 10-0.2 % ophthalmic suspension Place 1 drop into the right eye 4 (four) times daily.     No current facility-administered medications on file prior to visit.     Allergies  Allergen Reactions  . Naproxen Nausea And Vomiting  . Tramadol Nausea And Vomiting  . Vicodin [Hydrocodone-Acetaminophen] Nausea And Vomiting  . Oxycodone Nausea Only    Family History  Problem Relation Age of Onset  . Diabetes Father   . Heart disease Father   . Cancer Maternal Aunt        Breast, metastatic  . Cancer Maternal Grandmother        ovarian  . Thyroid disease Neg Hx     BP 124/80   Pulse 83   Ht 5\' 1"  (1.549 m)   Wt 169 lb 6.4 oz (76.8 kg)   SpO2 95%   BMI 32.01 kg/m    Review of Systems No weight change.     Objective:   Physical Exam VITAL SIGNS:  See vs page.   GENERAL: no distress.  NECK: left 1-2 cm nodule is palpable.   Lab Results  Component Value Date   TSH 0.03 (L) 10/04/2018      Assessment & Plan:  Hyperthyroidism: recurrent after RAI: I offered repeat RAI rx vs tapazole Multinodular goiter: improved after RAI: we'll follow

## 2018-10-05 ENCOUNTER — Other Ambulatory Visit: Payer: Self-pay | Admitting: Endocrinology

## 2018-10-05 MED ORDER — METHIMAZOLE 10 MG PO TABS: 10.0000 mg | ORAL_TABLET | Freq: Every day | ORAL | 2 refills | Status: DC

## 2018-11-02 ENCOUNTER — Telehealth: Payer: Self-pay | Admitting: Endocrinology

## 2018-11-02 NOTE — Telephone Encounter (Signed)
Opened in error

## 2018-11-02 NOTE — Telephone Encounter (Signed)
Called pt to clarify. Per pt, you sent the following message via MyChart:  OK, I have sent a prescription to your pharmacy, for the methimazole.  If ever you have fever while taking methimazole, stop it and call us, even if the reason is obvious, because of the risk of a rare side-effect.  Please come back for a follow-up appointment in 4-6 weeks.   I you want, you can change your mind in the future, and take the radioactive iodine pill.   After speaking to the pt, she states she has been taking the Methimazole and feels much better. Her specific question is, does she still need to schedule a follow up appt with you in 4-6 weeks? Please advise

## 2018-11-02 NOTE — Telephone Encounter (Signed)
Patient has called stating that she thinks she has lab work that needs to be do for Thyroid.  Would like a call back to confirm this information and if lab work is needed would like to have it done in [place] next week sometime  Pt said a myChart message would be preferred method of contact but if not a call to [phone]

## 2018-11-02 NOTE — Telephone Encounter (Signed)
Yes, because we had to resume the methimazole

## 2018-11-03 ENCOUNTER — Telehealth: Payer: Self-pay

## 2018-11-03 NOTE — Telephone Encounter (Signed)
Called pt and made her aware of Dr. George Hugh response. States she will call back to schedule. Does not have time to schedule at this time as she is with her mother at an appt.

## 2018-11-03 NOTE — Telephone Encounter (Signed)
error 

## 2018-11-10 ENCOUNTER — Encounter: Payer: Self-pay | Admitting: Endocrinology

## 2018-11-10 ENCOUNTER — Ambulatory Visit (INDEPENDENT_AMBULATORY_CARE_PROVIDER_SITE_OTHER): Payer: Medicare Other | Admitting: Endocrinology

## 2018-11-10 VITALS — BP 130/70 | HR 67 | Ht 61.0 in | Wt 169.8 lb

## 2018-11-10 DIAGNOSIS — E0590 Thyrotoxicosis, unspecified without thyrotoxic crisis or storm: Secondary | ICD-10-CM

## 2018-11-10 LAB — TSH: TSH: 3.2 u[IU]/mL (ref 0.35–4.50)

## 2018-11-10 LAB — T4, FREE: FREE T4: 0.6 ng/dL (ref 0.60–1.60)

## 2018-11-10 NOTE — Progress Notes (Signed)
Subjective:    Patient ID: Brooke Tucker, female    DOB: December 22, 1951, 67 y.o.   MRN: [id number]  HPI Pt returns for f/u of hyperthyroidism (she was dx'ed with a multinodular goiter in 2008, and hyperthyroidism in early 2019; she had RAI in 4/19; in 10/19; hyperthyroidism recurred, so tapazole was resumed; toprol is not for the thyroid).  pt states she feels well in general.  She takes tapazole as rx'ed.    Past Medical History:  Diagnosis Date  . Arthritis   . Dysrhythmia    tachycardia   . Hypertension   . Hyperthyroidism   . Multinodular goiter   . Ovarian cyst July 2008   laparscopic biopsy normal  . Rheumatoid arthritis(714.0)    managed by Beverley Fiedler with MTX    Past Surgical History:  Procedure Laterality Date  . COMBINED HYSTEROSCOPY DIAGNOSTIC / D&C  July 2008   [facility]  . ESOPHAGOGASTRODUODENOSCOPY  July 2008   normal  . EXCISION MORTON'S NEUROMA     Left foot  . KNEE ARTHROSCOPY Left 01/07/2016   Procedure: ARTHROSCOPY KNEE, lateral release, partial lateral menisectomy, excision plica;  Surgeon: Kennedy Bucker, MD;  Location: [facility];  Service: Orthopedics;  Laterality: Left;  . LEFT HEART CATH AND CORONARY ANGIOGRAPHY Left 02/03/2018   Procedure: LEFT HEART CATH AND CORONARY ANGIOGRAPHY;  Surgeon: Marcina Millard, MD;  Location: [facility];  Service: Cardiovascular;  Laterality: Left;  . METATARSAL OSTEOTOMY  dec 2012   right foot, 5th MT  ([facility])  . TOTAL KNEE ARTHROPLASTY Left 06/28/2018   Procedure: TOTAL KNEE ARTHROPLASTY;  Surgeon: Kennedy Bucker, MD;  Location: [facility];  Service: Orthopedics;  Laterality: Left;    Social History   Socioeconomic History  . Marital status: Married    Spouse name: Not on file  . Number of children: Not on file  . Years of education: Not on file  . Highest education level: Not on file  Occupational History  . Not on file  Social Needs  . Financial resource strain: Not on file  . Food insecurity:    Worry:  Not on file    Inability: Not on file  . Transportation needs:    Medical: Not on file    Non-medical: Not on file  Tobacco Use  . Smoking status: Never Smoker  . Smokeless tobacco: Never Used  Substance and Sexual Activity  . Alcohol use: Yes    Comment: occasional  . Drug use: No  . Sexual activity: Not Currently  Lifestyle  . Physical activity:    Days per week: Not on file    Minutes per session: Not on file  . Stress: Not on file  Relationships  . Social connections:    Talks on phone: Not on file    Gets together: Not on file    Attends religious service: Not on file    Active member of club or organization: Not on file    Attends meetings of clubs or organizations: Not on file    Relationship status: Not on file  . Intimate partner violence:    Fear of current or ex partner: Not on file    Emotionally abused: Not on file    Physically abused: Not on file    Forced sexual activity: Not on file  Other Topics Concern  . Not on file  Social History Narrative  . Not on file    Current Outpatient Medications on File Prior to Visit  Medication  Sig Dispense Refill  . diphenhydramine-acetaminophen (TYLENOL PM) 25-500 MG TABS tablet Take 1 tablet by mouth at bedtime.    . methimazole (TAPAZOLE) 10 MG tablet Take 1 tablet (10 mg total) by mouth daily. 30 tablet 2  . metoprolol succinate (TOPROL-XL) 25 MG 24 hr tablet Take 50 mg by mouth every morning.      No current facility-administered medications on file prior to visit.     Allergies  Allergen Reactions  . Naproxen Nausea And Vomiting  . Tramadol Nausea And Vomiting  . Vicodin [Hydrocodone-Acetaminophen] Nausea And Vomiting  . Oxycodone Nausea Only    Family History  Problem Relation Age of Onset  . Diabetes Father   . Heart disease Father   . Cancer Maternal Aunt        Breast, metastatic  . Cancer Maternal Grandmother        ovarian  . Thyroid disease Neg Hx     BP 130/70 (BP Location: Right Arm,  Patient Position: Sitting, Cuff Size: Normal)   Pulse 67   Ht 5\' 1"  (1.549 m)   Wt 169 lb 12.8 oz (77 kg)   SpO2 97%   BMI 32.08 kg/m    Review of Systems Denies fever    Objective:   Physical Exam VITAL SIGNS:  See vs page GENERAL: no distress NECK: 1-2 cm thyroid nodule is palpable near the isthmus.      Assessment & Plan:  Hyperthyroidism: recurrent after RAI.  recheck today. Thyroid nodule, clinically stable.    Patient Instructions  blood tests are requested for you today.  We'll let you know about the results.   If ever you have fever while taking methimazole, stop it and call , even if the reason is obvious, because of the risk of a rare side-effect.   We can redo the radioactive iodine pill in the future if you want Please come back for a follow-up appointment in 2-3 months.

## 2018-11-10 NOTE — Patient Instructions (Addendum)
blood tests are requested for you today.  We'll let you know about the results.   If ever you have fever while taking methimazole, stop it and call us, even if the reason is obvious, because of the risk of a rare side-effect.   We can redo the radioactive iodine pill in the future if you want Please come back for a follow-up appointment in 2-3 months.

## 2018-11-11 ENCOUNTER — Telehealth: Payer: Self-pay

## 2018-11-11 NOTE — Telephone Encounter (Signed)
Received notification from [facility] that patient has not completed Cologuard.  Called patient to follow up , he stated that recently she has had a lot of issues with her thyroid. She states she will complete .

## 2018-12-30 ENCOUNTER — Other Ambulatory Visit: Payer: Self-pay | Admitting: Endocrinology

## 2019-07-13 IMAGING — US US THYROID
1 series · 13 of 25 positions shown · non-contrast
Comparison: Nuclear medicine thyroid exam 03/17/2018. Ultrasound
thyroid 08/16/2007

CLINICAL DATA: Hyperthyroidism. Plan for radioactive iodine
treatment.

EXAM:
THYROID ULTRASOUND
TECHNIQUE: Ultrasound examination of the thyroid gland and adjacent soft
tissues was performed.

[Series 1: us thyroid · 0.08mm/px · 47 acquisitions, 13 frames shown]
[im 1/47]
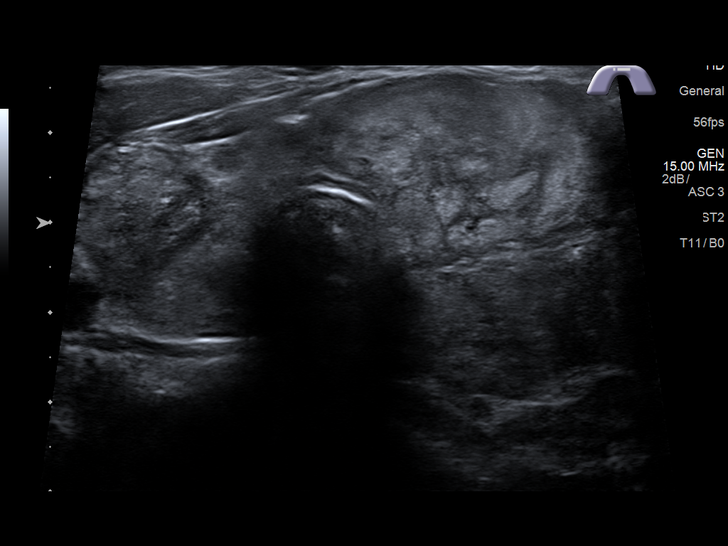
[im 4/47]
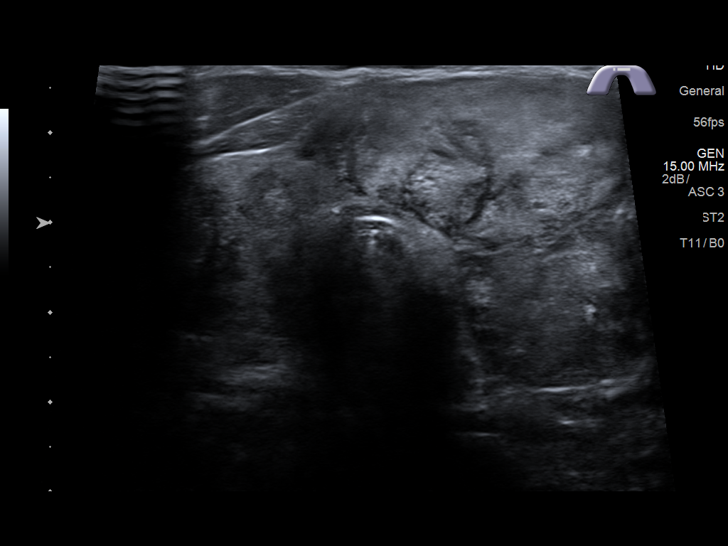
[im 8/47]
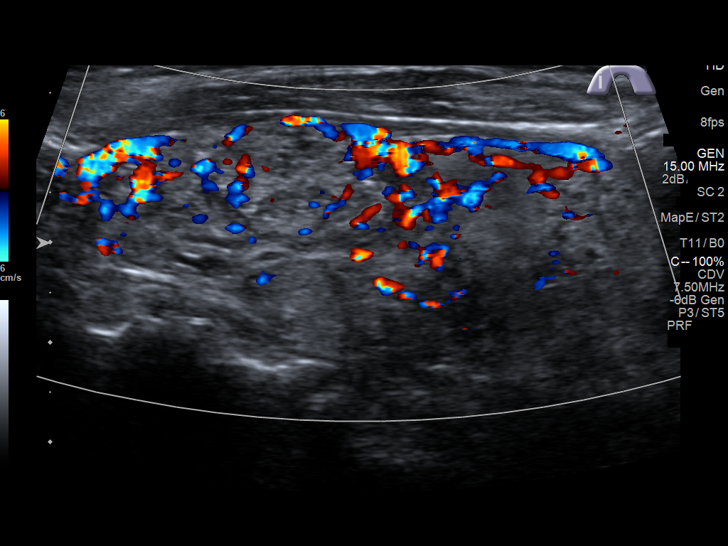
[im 12/47]
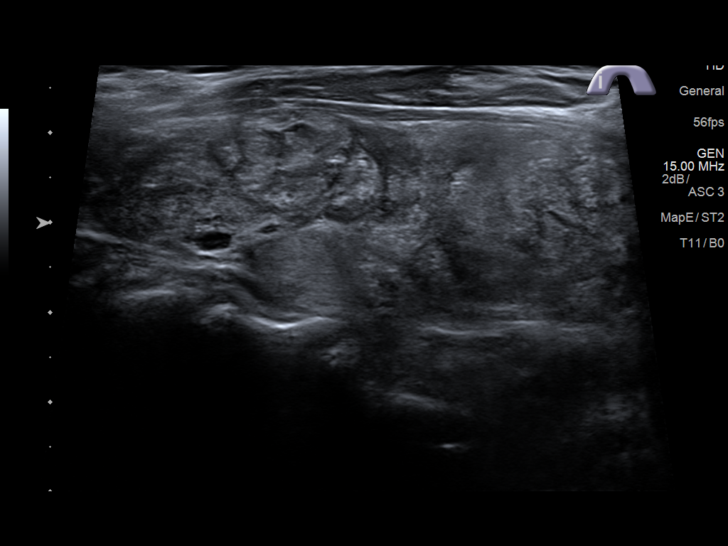
[im 16/47]
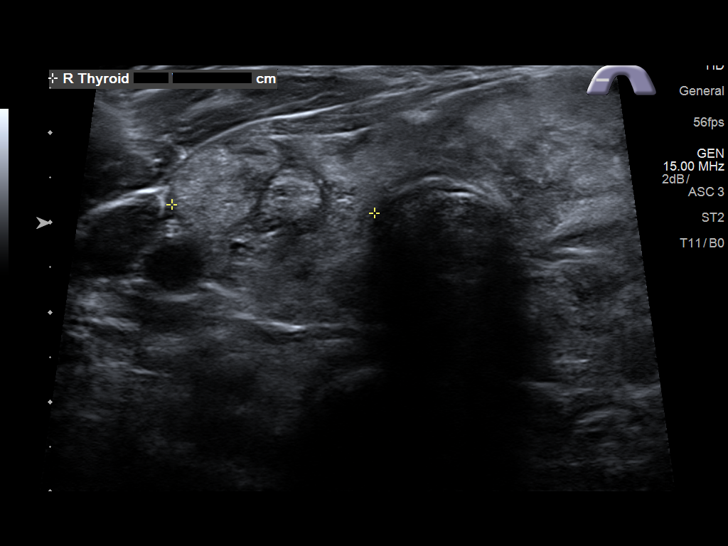
[im 20/47]
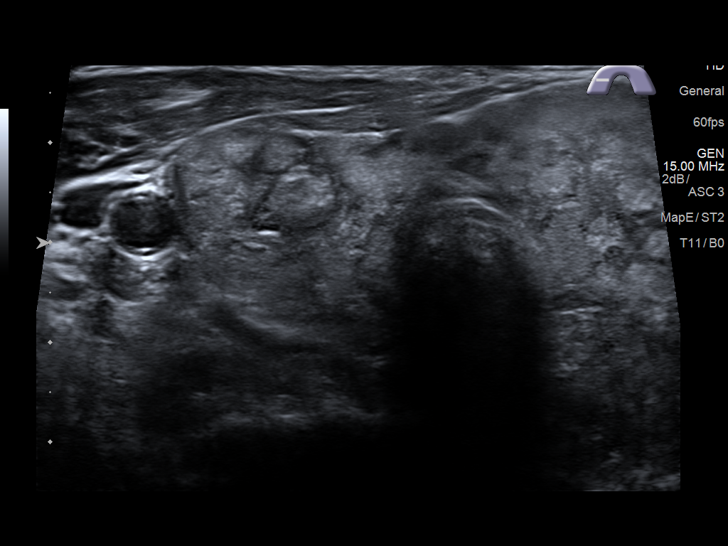
[im 24/47]
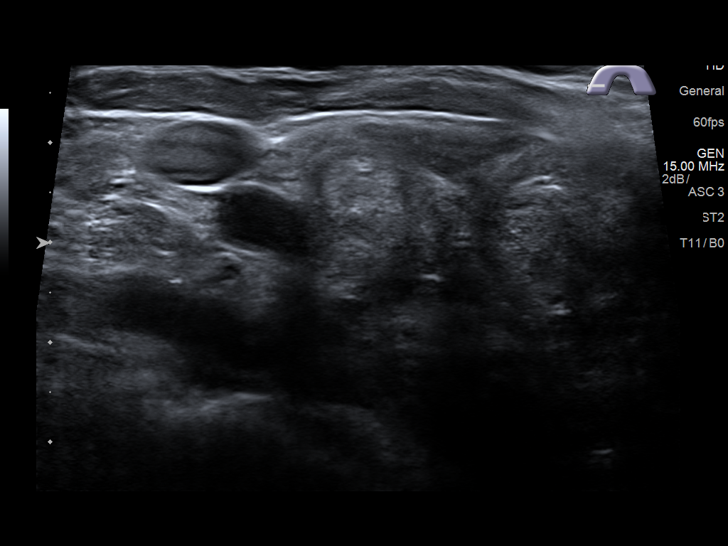
[im 27/47]
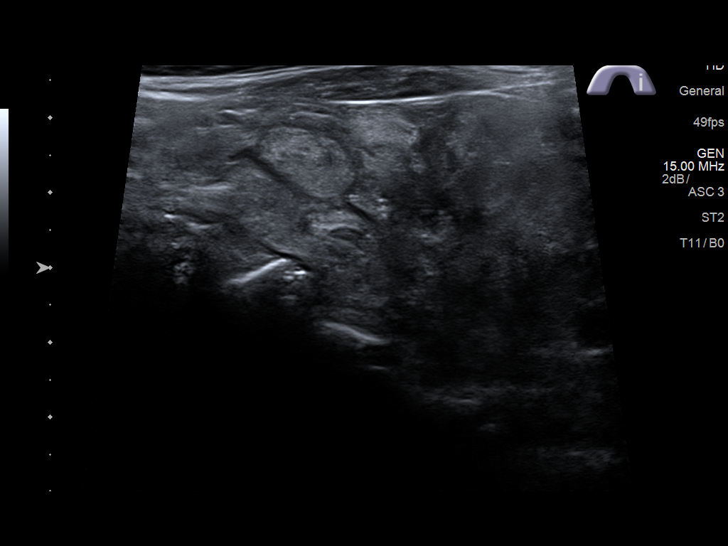
[im 31/47]
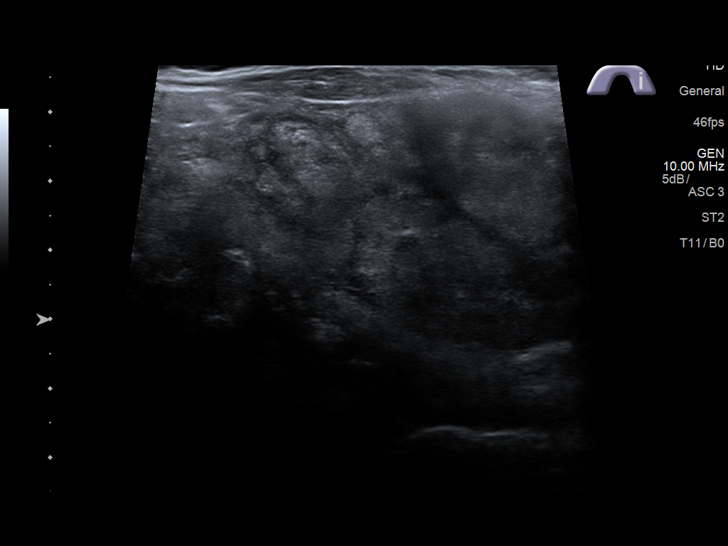
[im 35/47]
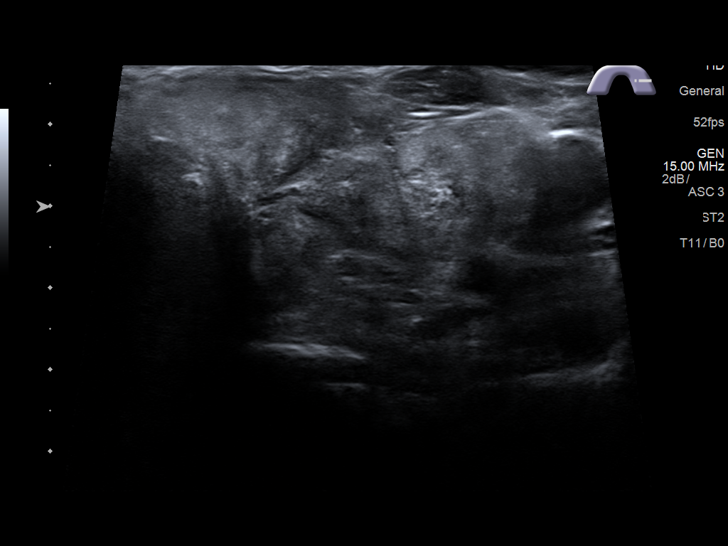
[im 39/47]
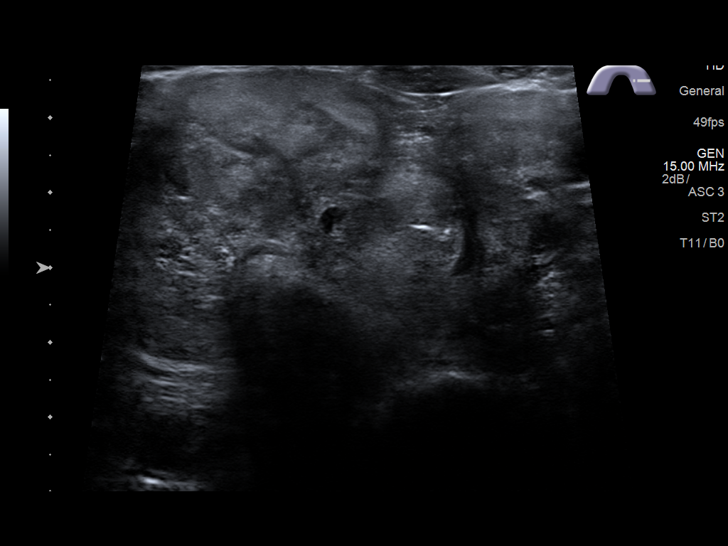
[im 43/47]
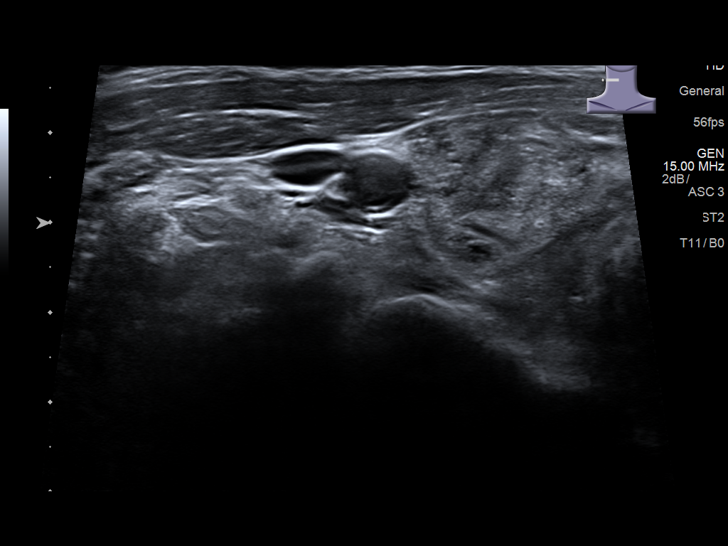
[im 47/47]
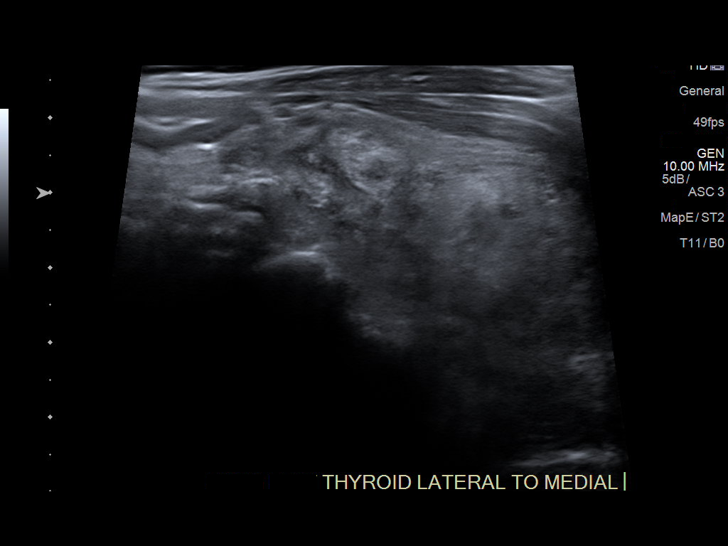

[13 of 25 positions shown; findings below may reference images not displayed]

FINDINGS: Parenchymal Echotexture: Markedly heterogenous

Isthmus: 1.4 cm, previously 1.6 cm

Right lobe: 5.9 x 2.4 x 2.3 cm, previously 6.4 x 2.3 x 2.0 cm

Left lobe: 6.2 x 3.1 x 3.5 cm, previously 6.7 x 3.2 x 3.0 cm

_________________________________________________________

Estimated total number of nodules >/= 1 cm: >10

Number of spongiform nodules >/=  2 cm not described below (TR1): 0

Number of mixed cystic and solid nodules >/= 1.5 cm not described
below (TR2): 0

_________________________________________________________

Thyroid tissue appears to have innumerable nodules. In particular,
the mid and inferior left thyroid lobe are markedly enlarged.
However, these innumerable nodules do not have distinct margins and
difficult to measure any individual nodules.

No significant lymph node enlargement.
IMPRESSION: Multinodular goiter. The thyroid tissue is diffusely heterogeneous
and appears to be made up of innumerable indistinct nodules.
Asymmetric enlargement of the left thyroid lobe.

## 2019-08-07 ENCOUNTER — Other Ambulatory Visit: Payer: Self-pay

## 2019-08-07 ENCOUNTER — Ambulatory Visit (INDEPENDENT_AMBULATORY_CARE_PROVIDER_SITE_OTHER): Payer: Medicare Other

## 2019-08-07 DIAGNOSIS — Z0000 Encounter for general adult medical examination without abnormal findings: Secondary | ICD-10-CM | POA: Diagnosis not present

## 2019-08-07 DIAGNOSIS — Z1231 Encounter for screening mammogram for malignant neoplasm of breast: Secondary | ICD-10-CM

## 2019-08-07 NOTE — Patient Instructions (Addendum)
  Brooke Tucker , Thank you for taking time to come for your Medicare Wellness Visit. I appreciate your ongoing commitment to your health goals. Please review the following plan we discussed and let me know if I can assist you in the future.   These are the goals we discussed: Goals      Patient Stated   . Weight (lb) < 169 lb (76.7 kg) (pt-stated)     Lose 10-15 lb        This is a list of the screening recommended for you and due dates:  Health Maintenance  Topic Date Due  . Mammogram  03/23/2016  . Colon Cancer Screening  06/27/2017  . Flu Shot  07/29/2019  . Tetanus Vaccine  10/20/2022  . DEXA scan (bone density measurement)  Completed  .  Hepatitis C: One time screening is recommended by Center for Disease Control  (CDC) for  adults born from [birth year] through 1965.   Completed  . Pneumonia vaccines  Completed

## 2019-08-07 NOTE — Progress Notes (Addendum)
Subjective:   Brooke Tucker is a 68 y.o. female who presents for an Initial Medicare Annual Wellness Visit.  Review of Systems    No ROS.  Medicare Wellness Virtual Visit.  Visual/audio telehealth visit, UTA vital signs.   See social history for additional risk factors.    Cardiac Risk Factors include: advanced age (>45men, >51 women)     Objective:    Today's Vitals   There is no height or weight on file to calculate BMI.  Advanced Directives 08/07/2019 06/28/2018 06/14/2018 02/03/2018  Does Patient Have a Medical Advance Directive? Yes No No No  Type of Estate agent of [place];Living will - - -  Does patient want to make changes to medical advance directive? No - Patient declined - - -  Copy of Healthcare Power of Attorney in Chart? No - copy requested - - -  Would patient like information on creating a medical advance directive? - Yes (Inpatient - patient requests chaplain consult to create a medical advance directive) Yes (MAU/Ambulatory/Procedural Areas - Information given) Yes (MAU/Ambulatory/Procedural Areas - Information given)    Current Medications (verified) Outpatient Encounter Medications as of 08/07/2019  Medication Sig  . diphenhydramine-acetaminophen (TYLENOL PM) 25-500 MG TABS tablet Take 1 tablet by mouth at bedtime.  . methimazole (TAPAZOLE) 10 MG tablet TAKE 1 TABLET(10 MG) BY MOUTH DAILY  . metoprolol succinate (TOPROL-XL) 25 MG 24 hr tablet Take 50 mg by mouth every morning.    No facility-administered encounter medications on file as of 08/07/2019.     Allergies (verified) Naproxen, Tramadol, Vicodin [hydrocodone-acetaminophen], and Oxycodone   History: Past Medical History:  Diagnosis Date  . Arthritis   . Dysrhythmia    tachycardia   . Hypertension   . Hyperthyroidism   . Multinodular goiter   . Ovarian cyst July 2008   laparscopic biopsy normal  . Rheumatoid arthritis(714.0)    managed by Beverley Fiedler with MTX   Past  Surgical History:  Procedure Laterality Date  . COMBINED HYSTEROSCOPY DIAGNOSTIC / D&C  July 2008   [place]  . ESOPHAGOGASTRODUODENOSCOPY  July 2008   normal  . EXCISION MORTON'S NEUROMA     Left foot  . KNEE ARTHROSCOPY Left 01/07/2016   Procedure: ARTHROSCOPY KNEE, lateral release, partial lateral menisectomy, excision plica;  Surgeon: Kennedy Bucker, MD;  Location: [facility];  Service: Orthopedics;  Laterality: Left;  . LEFT HEART CATH AND CORONARY ANGIOGRAPHY Left 02/03/2018   Procedure: LEFT HEART CATH AND CORONARY ANGIOGRAPHY;  Surgeon: Marcina Millard, MD;  Location: [facility];  Service: Cardiovascular;  Laterality: Left;  . METATARSAL OSTEOTOMY  dec 2012   right foot, 5th MT  ([facility])  . TOTAL KNEE ARTHROPLASTY Left 06/28/2018   Procedure: TOTAL KNEE ARTHROPLASTY;  Surgeon: Kennedy Bucker, MD;  Location: [facility];  Service: Orthopedics;  Laterality: Left;   Family History  Problem Relation Age of Onset  . Diabetes Father   . Heart disease Father   . Cancer Maternal Aunt        Breast, metastatic  . Cancer Maternal Grandmother        ovarian  . Dementia Mother   . Thyroid disease Neg Hx    Social History   Socioeconomic History  . Marital status: Married    Spouse name: Not on file  . Number of children: Not on file  . Years of education: Not on file  . Highest education level: Not on file  Occupational History  . Not on  file  Social Needs  . Financial resource strain: Not hard at all  . Food insecurity    Worry: Never true    Inability: Never true  . Transportation needs    Medical: No    Non-medical: No  Tobacco Use  . Smoking status: Never Smoker  . Smokeless tobacco: Never Used  Substance and Sexual Activity  . Alcohol use: Yes    Comment: occasional  . Drug use: No  . Sexual activity: Not Currently  Lifestyle  . Physical activity    Days per week: 5 days    Minutes per session: 50 min  . Stress: Not at all  Relationships  . Social  Musician on phone: Not on file    Gets together: Not on file    Attends religious service: Not on file    Active member of club or organization: Not on file    Attends meetings of clubs or organizations: Not on file    Relationship status: Not on file  Other Topics Concern  . Not on file  Social History Narrative  . Not on file    Tobacco Counseling Counseling given: Not Answered   Clinical Intake:  Pre-visit preparation completed: Yes        Diabetes: No  How often do you need to have someone help you when you read instructions, pamphlets, or other written materials from your doctor or pharmacy?: 1 - Never  Interpreter Needed?: No      Activities of Daily Living In your present state of health, do you have any difficulty performing the following activities: 08/07/2019  Hearing? N  Vision? N  Difficulty concentrating or making decisions? N  Walking or climbing stairs? Y  Dressing or bathing? N  Doing errands, shopping? N  Preparing Food and eating ? N  Using the Toilet? N  In the past six months, have you accidently leaked urine? N  Do you have problems with loss of bowel control? N  Managing your Medications? N  Managing your Finances? N  Housekeeping or managing your Housekeeping? N  Some recent data might be hidden     Immunizations and Health Maintenance Immunization History  Administered Date(s) Administered  . Influenza-Unspecified 09/14/2013, 09/18/2014, 10/16/2015, 10/21/2017  . Pneumococcal Conjugate-13 03/19/2015  . Pneumococcal Polysaccharide-23 10/23/2010, 02/11/2018  . Tdap 10/20/2012   Health Maintenance Due  Topic Date Due  . MAMMOGRAM  03/23/2016  . COLONOSCOPY  06/27/2017  . INFLUENZA VACCINE  07/29/2019    Patient Care Team: Sherlene Shams, MD as PCP - General (Internal Medicine) Sherlene Shams, MD (Internal Medicine)  Indicate any recent Medical Services you may have received from other than [facility] providers in the  past year (date may be approximate).     Assessment:   This is a routine wellness examination for Brooke Tucker.  I connected with patient 08/07/19 at  9:00 AM EDT by a video/audio enabled telemedicine application and verified that I am speaking with the correct person using two identifiers. Patient stated full name and DOB. Patient gave permission to continue with virtual visit. Patient's location was at home and Nurse's location was at [place] office.   Health Screenings  Mammogram - 02/2014; plans to schedule Colorguard- plans to complete Bone Density - 04/2014 Glaucoma -none Hearing -demonstrates normal hearing during visit. Labs followed by pcp Dental- U[facility] Vision- wears glasses  Social  Alcohol intake - yes      Smoking history- never    Smokers  in home? none Illicit drug use? none Exercise - water aerobics 4-5 times weekly, 45 minutes Diet - low carb Sexually Active -not currently BMI- discussed the importance of a healthy diet, water intake and the benefits of aerobic exercise.  Educational material provided.   Safety  Patient feels safe at home- yes Patient does have smoke detectors at home- yes Patient does wear sunscreen or protective clothing when in direct sunlight -yes Patient does wear seat belt when in a moving vehicle -yes Patient drives- yes  [id number] precautions and sickness symptoms discussed.   Activities of Daily Living Patient denies needing assistance with: driving, household chores, feeding themselves, getting from bed to chair, getting to the toilet, bathing/showering, dressing, managing money, or preparing meals.  No new identified risk were noted.    Depression Screen Patient denies losing interest in daily life, feeling hopeless, or crying easily over simple problems.   Medication-taking as directed and without issues.   Fall Screen Patient denies being afraid of falling or falling in the last year.   Memory Screen Patient is alert.  Patient  denies difficulty focusing, concentrating or misplacing items. Correctly identified the president of the [place], season and recall. Patient likes to play solitaire and complete crossword puzzles for brain stimulation.  Immunizations The following Immunizations were discussed: Influenza, shingles, pneumonia, and tetanus.   Other Providers Patient Care Team: Crecencio Mc, MD as PCP - General (Internal Medicine) Crecencio Mc, MD (Internal Medicine)  Hearing/Vision screen  Hearing Screening   125Hz  250Hz  500Hz  1000Hz  2000Hz  3000Hz  4000Hz  6000Hz  8000Hz   Right ear:           Left ear:           Comments: Patient is able to hear conversational tones without difficulty.  No issues reported.  Vision Screening Comments: Wears corrective lenses Visual acuity not assessed, virtual visit.  They have seen their ophthalmologist in the last 12 months.     Dietary issues and exercise activities discussed: Current Exercise Habits: Home exercise routine, Time (Minutes): 45, Frequency (Times/Week): 5, Weekly Exercise (Minutes/Week): 225, Intensity: Moderate  Goals      Patient Stated   . Weight (lb) < 169 lb (76.7 kg) (pt-stated)     Lose 10-15 lb       Depression Screen PHQ 2/9 Scores 08/07/2019 02/11/2018 03/19/2015 10/20/2012  PHQ - 2 Score 0 0 0 0  PHQ- 9 Score - 3 - -    Fall Risk Fall Risk  08/07/2019 02/11/2018 02/11/2018 03/19/2015  Falls in the past year? 0 No No No    Is the patient's home free of loose throw rugs in walkways, pet beds, electrical cords, etc?  yes      Grab bars in the bathroom? yes      Handrails on the stairs?  yes      Adequate lighting?  yes  Cognitive Function:     6CIT Screen 08/07/2019  What Year? 0 points  What month? 0 points  What time? 0 points  Count back from 20 0 points  Months in reverse 0 points    Screening Tests Health Maintenance  Topic Date Due  . MAMMOGRAM  03/23/2016  . COLONOSCOPY  06/27/2017  . INFLUENZA VACCINE  07/29/2019   . TETANUS/TDAP  10/20/2022  . DEXA SCAN  Completed  . Hepatitis C Screening  Completed  . PNA vac Low Risk Adult  Completed     Plan:    End of life planning; Advance aging;  Advanced directives discussed.  Copy of current HCPOA/Living Will requested.    Complete cologuard.   Schedule mammogram.  I have personally reviewed and noted the following in the patient's chart:   . Medical and social history . Use of alcohol, tobacco or illicit drugs  . Current medications and supplements . Functional ability and status . Nutritional status . Physical activity . Advanced directives . List of other physicians . Hospitalizations, surgeries, and ER visits in previous 12 months . Vitals . Screenings to include cognitive, depression, and falls . Referrals and appointments  In addition, I have reviewed and discussed with patient certain preventive protocols, quality metrics, and best practice recommendations. A written personalized care plan for preventive services as well as general preventive health recommendations were provided to patient.     OBrien-Blaney, Denisa L, LPN   [id number]/61/09608/09/2019      I have reviewed the above information and agree with above.   Duncan Dulleresa Tullo, MD

## 2019-08-21 ENCOUNTER — Encounter: Payer: Self-pay | Admitting: Internal Medicine

## 2019-08-22 ENCOUNTER — Encounter: Payer: Self-pay | Admitting: Internal Medicine

## 2019-08-28 ENCOUNTER — Other Ambulatory Visit: Payer: Self-pay | Admitting: Internal Medicine

## 2019-08-28 DIAGNOSIS — R921 Mammographic calcification found on diagnostic imaging of breast: Secondary | ICD-10-CM

## 2019-08-31 ENCOUNTER — Telehealth: Payer: Self-pay

## 2019-08-31 DIAGNOSIS — E0590 Thyrotoxicosis, unspecified without thyrotoxic crisis or storm: Secondary | ICD-10-CM

## 2019-08-31 DIAGNOSIS — R7301 Impaired fasting glucose: Secondary | ICD-10-CM

## 2019-08-31 DIAGNOSIS — E785 Hyperlipidemia, unspecified: Secondary | ICD-10-CM

## 2019-08-31 DIAGNOSIS — R5383 Other fatigue: Secondary | ICD-10-CM

## 2019-08-31 DIAGNOSIS — Z79899 Other long term (current) drug therapy: Secondary | ICD-10-CM

## 2019-08-31 NOTE — Telephone Encounter (Signed)
Pt would like to have labs done before physical on 09/26/2019. I have ordered CBC, CMP, A1c, Lipid, and TSH. Is there anything else that needs to be ordered?

## 2019-08-31 NOTE — Telephone Encounter (Signed)
Copied from [place] [id number]. Topic: General - Other >> Aug 31, 2019  8:39 AM Leward Quan A wrote: Reason for CRM: Patient is requesting orders from Dr Derrel Nip for her yearly blood work to be placed so she can have it done before her appointment. Please advise

## 2019-08-31 NOTE — Telephone Encounter (Signed)
Great, thank you!

## 2019-09-26 ENCOUNTER — Other Ambulatory Visit: Payer: Medicare Other

## 2019-09-26 ENCOUNTER — Ambulatory Visit: Payer: Medicare Other

## 2019-09-26 ENCOUNTER — Other Ambulatory Visit (INDEPENDENT_AMBULATORY_CARE_PROVIDER_SITE_OTHER): Payer: Medicare Other

## 2019-09-26 ENCOUNTER — Other Ambulatory Visit: Payer: Self-pay

## 2019-09-26 DIAGNOSIS — Z79899 Other long term (current) drug therapy: Secondary | ICD-10-CM

## 2019-09-26 DIAGNOSIS — E785 Hyperlipidemia, unspecified: Secondary | ICD-10-CM | POA: Diagnosis not present

## 2019-09-26 DIAGNOSIS — Z23 Encounter for immunization: Secondary | ICD-10-CM | POA: Diagnosis not present

## 2019-09-26 DIAGNOSIS — R5383 Other fatigue: Secondary | ICD-10-CM

## 2019-09-26 DIAGNOSIS — E0590 Thyrotoxicosis, unspecified without thyrotoxic crisis or storm: Secondary | ICD-10-CM

## 2019-09-26 DIAGNOSIS — R7301 Impaired fasting glucose: Secondary | ICD-10-CM

## 2019-09-26 LAB — LIPID PANEL
Cholesterol: 197 mg/dL (ref 0–200)
HDL: 87.9 mg/dL (ref >39.00)
LDL Cholesterol: 90 mg/dL (ref 0–99)
NonHDL: 108.63
Total CHOL/HDL Ratio: 2
Triglycerides: 92 mg/dL (ref 0.0–149.0)
VLDL: 18.4 mg/dL (ref 0.0–40.0)

## 2019-09-26 LAB — CBC WITH DIFFERENTIAL/PLATELET
Basophils Absolute: 0 10*3/uL (ref 0.0–0.1)
Basophils Relative: 0.5 % (ref 0.0–3.0)
Eosinophils Absolute: 0.3 10*3/uL (ref 0.0–0.7)
Eosinophils Relative: 5.1 % — ABNORMAL HIGH (ref 0.0–5.0)
HCT: 40.4 % (ref 36.0–46.0)
Hemoglobin: 13.3 g/dL (ref 12.0–15.0)
Lymphocytes Relative: 28.6 % (ref 12.0–46.0)
Lymphs Abs: 1.9 10*3/uL (ref 0.7–4.0)
MCHC: 33 g/dL (ref 30.0–36.0)
MCV: 91.4 fl (ref 78.0–100.0)
Monocytes Absolute: 0.5 10*3/uL (ref 0.1–1.0)
Monocytes Relative: 8.3 % (ref 3.0–12.0)
Neutro Abs: 3.8 10*3/uL (ref 1.4–7.7)
Neutrophils Relative %: 57.5 % (ref 43.0–77.0)
Platelets: 213 10*3/uL (ref 150.0–400.0)
RBC: 4.42 Mil/uL (ref 3.87–5.11)
RDW: 14.8 % (ref 11.5–15.5)
WBC: 6.6 10*3/uL (ref 4.0–10.5)

## 2019-09-26 LAB — COMPREHENSIVE METABOLIC PANEL
ALT: 7 U/L (ref 0–35)
AST: 13 U/L (ref 0–37)
Albumin: 4.2 g/dL (ref 3.5–5.2)
Alkaline Phosphatase: 80 U/L (ref 39–117)
BUN: 9 mg/dL (ref 6–23)
CO2: 29 mEq/L (ref 19–32)
Calcium: 9.3 mg/dL (ref 8.4–10.5)
Chloride: 102 mEq/L (ref 96–112)
Creatinine, Ser: 0.7 mg/dL (ref 0.40–1.20)
GFR: 83.2 mL/min (ref >60.00)
Glucose, Bld: 78 mg/dL (ref 70–99)
Potassium: 4.5 mEq/L (ref 3.5–5.1)
Sodium: 140 mEq/L (ref 135–145)
Total Bilirubin: 0.5 mg/dL (ref 0.2–1.2)
Total Protein: 7.2 g/dL (ref 6.0–8.3)

## 2019-09-26 LAB — HEMOGLOBIN A1C: Hgb A1c MFr Bld: 5.7 % (ref 4.6–6.5)

## 2019-09-26 LAB — TSH: TSH: 2.61 u[IU]/mL (ref 0.35–4.50)

## 2019-09-27 NOTE — Progress Notes (Signed)
Patient came in for flu shot

## 2019-09-29 ENCOUNTER — Ambulatory Visit
Admission: RE | Admit: 2019-09-29 | Discharge: 2019-09-29 | Disposition: A | Discharge status: Home / Self Care | Payer: Medicare Other | Source: Ambulatory Visit | Attending: Internal Medicine | Admitting: Internal Medicine

## 2019-09-29 DIAGNOSIS — R921 Mammographic calcification found on diagnostic imaging of breast: Secondary | ICD-10-CM | POA: Diagnosis present

## 2019-10-01 ENCOUNTER — Encounter: Payer: Self-pay | Admitting: Internal Medicine

## 2019-10-02 ENCOUNTER — Other Ambulatory Visit: Payer: Self-pay | Admitting: Internal Medicine

## 2019-10-02 DIAGNOSIS — R928 Other abnormal and inconclusive findings on diagnostic imaging of breast: Secondary | ICD-10-CM

## 2019-10-02 DIAGNOSIS — R921 Mammographic calcification found on diagnostic imaging of breast: Secondary | ICD-10-CM

## 2019-10-09 ENCOUNTER — Other Ambulatory Visit: Payer: Self-pay

## 2019-10-09 ENCOUNTER — Ambulatory Visit (INDEPENDENT_AMBULATORY_CARE_PROVIDER_SITE_OTHER): Payer: Medicare Other | Admitting: Internal Medicine

## 2019-10-09 ENCOUNTER — Encounter: Payer: Self-pay | Admitting: Internal Medicine

## 2019-10-09 DIAGNOSIS — B3789 Other sites of candidiasis: Secondary | ICD-10-CM

## 2019-10-09 DIAGNOSIS — E669 Obesity, unspecified: Secondary | ICD-10-CM

## 2019-10-09 DIAGNOSIS — I422 Other hypertrophic cardiomyopathy: Secondary | ICD-10-CM | POA: Insufficient documentation

## 2019-10-09 DIAGNOSIS — Z96652 Presence of left artificial knee joint: Secondary | ICD-10-CM | POA: Diagnosis not present

## 2019-10-09 DIAGNOSIS — I429 Cardiomyopathy, unspecified: Secondary | ICD-10-CM | POA: Insufficient documentation

## 2019-10-09 MED ORDER — ALPRAZOLAM 0.5 MG PO TABS: 0.5000 mg | ORAL_TABLET | Freq: Every evening | ORAL | 0 refills | Status: DC | PRN

## 2019-10-09 MED ORDER — NYSTATIN 100000 UNIT/GM EX POWD: Freq: Two times a day (BID) | CUTANEOUS | 3 refills | Status: DC

## 2019-10-09 NOTE — Assessment & Plan Note (Signed)
encouraraged to continue  low glycemic index lifestyle, intermittent fasting.  

## 2019-10-09 NOTE — Progress Notes (Signed)
Virtual Visit via [email or web address]   This visit type was conducted due to national recommendations for restrictions regarding the COVID-19 pandemic (e.g. social distancing).  This format is felt to be most appropriate for this patient at this time.  All issues noted in this document were discussed and addressed.  No physical exam was performed (except for noted visual exam findings with Video Visits).   I connected with@ on 10/09/19 at 10:00 AM EDT by a video enabled telemedicine application and verified that I am speaking with the correct person using two identifiers. Location patient: home Location provider: work or home office Persons participating in the virtual visit: patient, provider  I discussed the limitations, risks, security and privacy concerns of performing an evaluation and management service by telephone and the availability of in person appointments. I also discussed with the patient that there may be a patient responsible charge related to this service. The patient expressed understanding and agreed to proceed.   Reason for visit: follow up on multiple issues   HPI:  68 yr old female with rheumatoid arthritis s/p left TKR , MNG with hyperthyroidism presents for follow up.   Evaluated by cardiology in 2018 for palpitations secondary PVCs .  Myoview and ECHO noted LVEF 45% hypocontractile septum  , mild AS, mild to moderate anterioir septal and lateral wall ischemia. Normal  coronaries by subsequent cardiac cath Feb 2019.    Still having sob with ambulation despite being able  to tread water this summer for nearly 40 minutes  Trouble losing weight  despite following the KETO diet. For 2 months Discussed a1c of 5.7  Bilateral breast biopsies this week for calcifications  Recurrent rash under breasts and in natal cleft.  Used nystatin in the past.  Needs refills  preoprocedure anxiety: alprazolam refill equested  .    ROS: See pertinent positives and negatives per HPI.  Past  Medical History:  Diagnosis Date  . Arthritis   . Dysrhythmia    tachycardia   . Hypertension   . Hyperthyroidism   . Multinodular goiter   . Ovarian cyst July 2008   laparscopic biopsy normal  . Rheumatoid arthritis(714.0)    managed by Beverley Fiedler with MTX    Past Surgical History:  Procedure Laterality Date  . BREAST BIOPSY Left 2012   fibroadenoma  . COMBINED HYSTEROSCOPY DIAGNOSTIC / D&C  July 2008   [place]  . ESOPHAGOGASTRODUODENOSCOPY  July 2008   normal  . EXCISION MORTON'S NEUROMA     Left foot  . KNEE ARTHROSCOPY Left 01/07/2016   Procedure: ARTHROSCOPY KNEE, lateral release, partial lateral menisectomy, excision plica;  Surgeon: Kennedy Bucker, MD;  Location: [facility];  Service: Orthopedics;  Laterality: Left;  . LEFT HEART CATH AND CORONARY ANGIOGRAPHY Left 02/03/2018   Procedure: LEFT HEART CATH AND CORONARY ANGIOGRAPHY;  Surgeon: Marcina Millard, MD;  Location: [facility];  Service: Cardiovascular;  Laterality: Left;  . METATARSAL OSTEOTOMY  dec 2012   right foot, 5th MT  ([place])  . TOTAL KNEE ARTHROPLASTY Left 06/28/2018   Procedure: TOTAL KNEE ARTHROPLASTY;  Surgeon: Kennedy Bucker, MD;  Location: [facility];  Service: Orthopedics;  Laterality: Left;    Family History  Problem Relation Age of Onset  . Diabetes Father   . Heart disease Father   . Cancer Maternal Aunt        Breast, metastatic  . Breast cancer Maternal Aunt 60  . Cancer Maternal Grandmother        ovarian  .  Dementia Mother   . Thyroid disease Neg Hx     SOCIAL HX:  reports that she has never smoked. She has never used smokeless tobacco. She reports current alcohol use. She reports that she does not use drugs.   Current Outpatient Medications:  .  diphenhydramine-acetaminophen (TYLENOL PM) 25-500 MG TABS tablet, Take 1 tablet by mouth at bedtime., Disp: , Rfl:  .  methimazole (TAPAZOLE) 10 MG tablet, TAKE 1 TABLET(10 MG) BY MOUTH DAILY, Disp: 30 tablet, Rfl: 2 .  metoprolol  succinate (TOPROL-XL) 25 MG 24 hr tablet, Take 50 mg by mouth every morning. , Disp: , Rfl:  .  ALPRAZolam (XANAX) 0.5 MG tablet, Take 1 tablet (0.5 mg total) by mouth at bedtime as needed for anxiety., Disp: 20 tablet, Rfl: 0 .  nystatin (MYCOSTATIN/NYSTOP) powder, Apply topically 2 (two) times daily., Disp: 15 g, Rfl: 3  EXAM:  VITALS per patient if applicable:  GENERAL: alert, oriented, appears well and in no acute distress  HEENT: atraumatic, conjunttiva clear, no obvious abnormalities on inspection of external nose and ears  NECK: normal movements of the head and neck  LUNGS: on inspection no signs of respiratory distress, breathing rate appears normal, no obvious gross SOB, gasping or wheezing  CV: no obvious cyanosis  MS: moves all visible extremities without noticeable abnormality  PSYCH/NEURO: pleasant and cooperative, no obvious depression or anxiety, speech and thought processing grossly intact  ASSESSMENT AND PLAN:  Discussed the following assessment and plan:  Hypertrophic nonobstructive cardiomyopathy (HCC)  Obesity (BMI 30-39.9)  Candidiasis of breast  S/P TKR (total knee replacement) using cement, left  Cardiomyopathy (HCC) Ef 45% , hypocontractile septum. Normal coronaries (29 Jan 2018 cardiac cath)  Obesity (BMI 30-39.9) encouraraged to continue  low glycemic index lifestyle, intermittent fasting.   S/P TKR (total knee replacement) using cement, left Nystatin powder for outbreaks,  Gold bond powder with zinc for maintenance     I discussed the assessment and treatment plan with the patient. The patient was provided an opportunity to ask questions and all were answered. The patient agreed with the plan and demonstrated an understanding of the instructions..   The patient was advised to call back or seek an in-person evaluation if the symptoms worsen or if the condition fails to improve as anticipated.    I provided  25 minutes of non-face-to-face time  during this encounter reviewing patient's current problems and post surgeries.  Providing counseling on the above mentioned problems , and coordination  of care .   Crecencio Mc, MD

## 2019-10-09 NOTE — Assessment & Plan Note (Signed)
Nystatin powder for outbreaks,  Gold bond powder with zinc for maintenance

## 2019-10-09 NOTE — Assessment & Plan Note (Signed)
Ef 45% , hypocontractile septum. Normal coronaries (29 Jan 2018 cardiac cath)

## 2019-10-12 ENCOUNTER — Ambulatory Visit
Admission: RE | Admit: 2019-10-12 | Discharge: 2019-10-12 | Disposition: A | Discharge status: Home / Self Care | Payer: Medicare Other | Source: Ambulatory Visit | Attending: Internal Medicine | Admitting: Internal Medicine

## 2019-10-12 DIAGNOSIS — R921 Mammographic calcification found on diagnostic imaging of breast: Secondary | ICD-10-CM

## 2019-10-12 DIAGNOSIS — R928 Other abnormal and inconclusive findings on diagnostic imaging of breast: Secondary | ICD-10-CM | POA: Diagnosis present

## 2019-10-12 HISTORY — PX: BREAST BIOPSY: SHX20

## 2019-10-13 LAB — SURGICAL PATHOLOGY

## 2019-10-14 ENCOUNTER — Encounter: Payer: Self-pay | Admitting: Internal Medicine

## 2019-10-14 DIAGNOSIS — Z9889 Other specified postprocedural states: Secondary | ICD-10-CM | POA: Insufficient documentation

## 2020-07-29 ENCOUNTER — Other Ambulatory Visit: Payer: Self-pay | Admitting: Internal Medicine

## 2020-07-29 DIAGNOSIS — R921 Mammographic calcification found on diagnostic imaging of breast: Secondary | ICD-10-CM

## 2020-08-07 ENCOUNTER — Ambulatory Visit (INDEPENDENT_AMBULATORY_CARE_PROVIDER_SITE_OTHER): Payer: Medicare Other

## 2020-08-07 VITALS — Ht 61.0 in | Wt 190.0 lb

## 2020-08-07 DIAGNOSIS — Z0000 Encounter for general adult medical examination without abnormal findings: Secondary | ICD-10-CM | POA: Diagnosis not present

## 2020-08-07 NOTE — Patient Instructions (Addendum)
Brooke Tucker , Thank you for taking time to come for your Medicare Wellness Visit. I appreciate your ongoing commitment to your health goals. Please review the following plan we discussed and let me know if I can assist you in the future.   These are the goals we discussed: Goals      Patient Stated   .  Increase physical activity (pt-stated)      I want to increase my daily steps Swim more for exercise Join senior classes     .  Weight (lb) < 169 lb (76.7 kg) (pt-stated)      Lose 10-15 lb        This is a list of the screening recommended for you and due dates:  Health Maintenance  Topic Date Due  . Cologuard (Stool DNA test)  Never done  . Flu Shot  07/28/2020  . Mammogram  09/28/2021  . Tetanus Vaccine  10/20/2022  . DEXA scan (bone density measurement)  Completed  . COVID-19 Vaccine  Completed  .  Hepatitis C: One time screening is recommended by Center for Disease Control  (CDC) for  adults born from [birth year] through 1965.   Completed  . Pneumonia vaccines  Completed    Immunizations Immunization History  Administered Date(s) Administered  . Fluad Quad(high Dose 65+) 09/26/2019  . Influenza, High Dose Seasonal PF 10/14/2018  . Influenza-Unspecified 09/14/2013, 09/18/2014, 10/16/2015, 10/21/2017  . Moderna SARS-COVID-2 Vaccination 01/23/2020, 02/20/2020  . Pneumococcal Conjugate-13 03/19/2015  . Pneumococcal Polysaccharide-23 10/23/2010, 02/11/2018  . Tdap 10/20/2012   Keep all routine maintenance appointments.   Follow up 10/14/20 @ 10:00  Advanced directives: Mailed to patient per request.   Conditions/risks identified: none new.  Follow up in one year for your annual wellness visit.   Preventive Care 67 Years and Older, Female Preventive care refers to lifestyle choices and visits with your health care provider that can promote health and wellness. What does preventive care include?  A yearly physical exam. This is also called an annual well  check.  Dental exams once or twice a year.  Routine eye exams. Ask your health care provider how often you should have your eyes checked.  Personal lifestyle choices, including:  Daily care of your teeth and gums.  Regular physical activity.  Eating a healthy diet.  Avoiding tobacco and drug use.  Limiting alcohol use.  Practicing safe sex.  Taking low-dose aspirin every day.  Taking vitamin and mineral supplements as recommended by your health care provider. What happens during an annual well check? The services and screenings done by your health care provider during your annual well check will depend on your age, overall health, lifestyle risk factors, and family history of disease. Counseling  Your health care provider may ask you questions about your:  Alcohol use.  Tobacco use.  Drug use.  Emotional well-being.  Home and relationship well-being.  Sexual activity.  Eating habits.  History of falls.  Memory and ability to understand (cognition).  Work and work Astronomer.  Reproductive health. Screening  You may have the following tests or measurements:  Height, weight, and BMI.  Blood pressure.  Lipid and cholesterol levels. These may be checked every 5 years, or more frequently if you are over 30 years old.  Skin check.  Lung cancer screening. You may have this screening every year starting at age 57 if you have a 30-pack-year history of smoking and currently smoke or have quit within the past 15 years.  Fecal  occult blood test (FOBT) of the stool. You may have this test every year starting at age 73.  Flexible sigmoidoscopy or colonoscopy. You may have a sigmoidoscopy every 5 years or a colonoscopy every 10 years starting at age 65.  Hepatitis C blood test.  Hepatitis B blood test.  Sexually transmitted disease (STD) testing.  Diabetes screening. This is done by checking your blood sugar (glucose) after you have not eaten for a while  (fasting). You may have this done every 1-3 years.  Bone density scan. This is done to screen for osteoporosis. You may have this done starting at age 23.  Mammogram. This may be done every 1-2 years. Talk to your health care provider about how often you should have regular mammograms. Talk with your health care provider about your test results, treatment options, and if necessary, the need for more tests. Vaccines  Your health care provider may recommend certain vaccines, such as:  Influenza vaccine. This is recommended every year.  Tetanus, diphtheria, and acellular pertussis (Tdap, Td) vaccine. You may need a Td booster every 10 years.  Zoster vaccine. You may need this after age 34.  Pneumococcal 13-valent conjugate (PCV13) vaccine. One dose is recommended after age 43.  Pneumococcal polysaccharide (PPSV23) vaccine. One dose is recommended after age 65. Talk to your health care provider about which screenings and vaccines you need and how often you need them. This information is not intended to replace advice given to you by your health care provider. Make sure you discuss any questions you have with your health care provider. Document Released: 01/10/2016 Document Revised: 09/02/2016 Document Reviewed: 10/15/2015 Elsevier Interactive Patient Education  2017 [employer] Prevention in the Home Falls can cause injuries. They can happen to people of all ages. There are many things you can do to make your home safe and to help prevent falls. What can I do on the outside of my home?  Regularly fix the edges of walkways and driveways and fix any cracks.  Remove anything that might make you trip as you walk through a door, such as a raised step or threshold.  Trim any bushes or trees on the path to your home.  Use bright outdoor lighting.  Clear any walking paths of anything that might make someone trip, such as rocks or tools.  Regularly check to see if handrails are loose  or broken. Make sure that both sides of any steps have handrails.  Any raised decks and porches should have guardrails on the edges.  Have any leaves, snow, or ice cleared regularly.  Use sand or salt on walking paths during winter.  Clean up any spills in your garage right away. This includes oil or grease spills. What can I do in the bathroom?  Use night lights.  Install grab bars by the toilet and in the tub and shower. Do not use towel bars as grab bars.  Use non-skid mats or decals in the tub or shower.  If you need to sit down in the shower, use a plastic, non-slip stool.  Keep the floor dry. Clean up any water that spills on the floor as soon as it happens.  Remove soap buildup in the tub or shower regularly.  Attach bath mats securely with double-sided non-slip rug tape.  Do not have throw rugs and other things on the floor that can make you trip. What can I do in the bedroom?  Use night lights.  Make sure that you  have a light by your bed that is easy to reach.  Do not use any sheets or blankets that are too big for your bed. They should not hang down onto the floor.  Have a firm chair that has side arms. You can use this for support while you get dressed.  Do not have throw rugs and other things on the floor that can make you trip. What can I do in the kitchen?  Clean up any spills right away.  Avoid walking on wet floors.  Keep items that you use a lot in easy-to-reach places.  If you need to reach something above you, use a strong step stool that has a grab bar.  Keep electrical cords out of the way.  Do not use floor polish or wax that makes floors slippery. If you must use wax, use non-skid floor wax.  Do not have throw rugs and other things on the floor that can make you trip. What can I do with my stairs?  Do not leave any items on the stairs.  Make sure that there are handrails on both sides of the stairs and use them. Fix handrails that are  broken or loose. Make sure that handrails are as long as the stairways.  Check any carpeting to make sure that it is firmly attached to the stairs. Fix any carpet that is loose or worn.  Avoid having throw rugs at the top or bottom of the stairs. If you do have throw rugs, attach them to the floor with carpet tape.  Make sure that you have a light switch at the top of the stairs and the bottom of the stairs. If you do not have them, ask someone to add them for you. What else can I do to help prevent falls?  Wear shoes that:  Do not have high heels.  Have rubber bottoms.  Are comfortable and fit you well.  Are closed at the toe. Do not wear sandals.  If you use a stepladder:  Make sure that it is fully opened. Do not climb a closed stepladder.  Make sure that both sides of the stepladder are locked into place.  Ask someone to hold it for you, if possible.  Clearly mark and make sure that you can see:  Any grab bars or handrails.  First and last steps.  Where the edge of each step is.  Use tools that help you move around (mobility aids) if they are needed. These include:  Canes.  Walkers.  Scooters.  Crutches.  Turn on the lights when you go into a dark area. Replace any light bulbs as soon as they burn out.  Set up your furniture so you have a clear path. Avoid moving your furniture around.  If any of your floors are uneven, fix them.  If there are any pets around you, be aware of where they are.  Review your medicines with your doctor. Some medicines can make you feel dizzy. This can increase your chance of falling. Ask your doctor what other things that you can do to help prevent falls. This information is not intended to replace advice given to you by your health care provider. Make sure you discuss any questions you have with your health care provider. Document Released: 10/10/2009 Document Revised: 05/21/2016 Document Reviewed: 01/18/2015 Elsevier  Interactive Patient Education  2017 [employer].

## 2020-08-07 NOTE — Progress Notes (Addendum)
Subjective:   Brooke Tucker is a 69 y.o. female who presents for Medicare Annual (Subsequent) preventive examination.  Review of Systems    No ROS.  Medicare Wellness Virtual Visit.   Cardiac Risk Factors include: advanced age (>41men, >71 women)     Objective:    Today's Vitals   08/07/20 0903  Weight: 190 lb (86.2 kg)  Height: 5\' 1"  (1.549 m)   Body mass index is 35.9 kg/m.  Advanced Directives 08/07/2020 08/07/2019 06/28/2018 06/14/2018 02/03/2018  Does Patient Have a Medical Advance Directive? No Yes No No No  Type of Advance Directive - Healthcare Power of [place];Living will - - -  Does patient want to make changes to medical advance directive? - No - Patient declined - - -  Copy of Healthcare Power of Attorney in Chart? - No - copy requested - - -  Would patient like information on creating a medical advance directive? Yes (MAU/Ambulatory/Procedural Areas - Information given) - Yes (Inpatient - patient requests chaplain consult to create a medical advance directive) Yes (MAU/Ambulatory/Procedural Areas - Information given) Yes (MAU/Ambulatory/Procedural Areas - Information given)    Current Medications (verified) Outpatient Encounter Medications as of 08/07/2020  Medication Sig   metoprolol succinate (TOPROL-XL) 25 MG 24 hr tablet Take 50 mg by mouth every morning.    ALPRAZolam (XANAX) 0.5 MG tablet Take 1 tablet (0.5 mg total) by mouth at bedtime as needed for anxiety.   diphenhydramine-acetaminophen (TYLENOL PM) 25-500 MG TABS tablet Take 1 tablet by mouth at bedtime.   methimazole (TAPAZOLE) 10 MG tablet TAKE 1 TABLET(10 MG) BY MOUTH DAILY (Patient not taking: Reported on 08/07/2020)   nystatin (MYCOSTATIN/NYSTOP) powder Apply topically 2 (two) times daily.   No facility-administered encounter medications on file as of 08/07/2020.    Allergies (verified) Naproxen, Tramadol, Vicodin [hydrocodone-acetaminophen], and Oxycodone   History: Past Medical History:    Diagnosis Date   Arthritis    Dysrhythmia    tachycardia    Hypertension    Hyperthyroidism    Multinodular goiter    Ovarian cyst July 2008   laparscopic biopsy normal   Rheumatoid arthritis(714.0)    managed by August 2008 with MTX   Past Surgical History:  Procedure Laterality Date   BREAST BIOPSY Left 2012   fibroadenoma   BREAST BIOPSY Left 10/12/2019   Affirm Bx- Ribbon clip- Path pending   BREAST BIOPSY Right 10815/2020   Affirm Bx- X-clip- Path pending   COMBINED HYSTEROSCOPY DIAGNOSTIC / D&C  July 2008   [place]   ESOPHAGOGASTRODUODENOSCOPY  July 2008   normal   EXCISION MORTON'S NEUROMA     Left foot   KNEE ARTHROSCOPY Left 01/07/2016   Procedure: ARTHROSCOPY KNEE, lateral release, partial lateral menisectomy, excision plica;  Surgeon: 03/06/2016, MD;  Location: [facility] ORS;  Service: Orthopedics;  Laterality: Left;   LEFT HEART CATH AND CORONARY ANGIOGRAPHY Left 02/03/2018   Procedure: LEFT HEART CATH AND CORONARY ANGIOGRAPHY;  Surgeon: 04/03/2018, MD;  Location: [facility];  Service: Cardiovascular;  Laterality: Left;   METATARSAL OSTEOTOMY  dec 2012   right foot, 5th MT  ([facility])   TOTAL KNEE ARTHROPLASTY Left 06/28/2018   Procedure: TOTAL KNEE ARTHROPLASTY;  Surgeon: 08/29/2018, MD;  Location: [facility] ORS;  Service: Orthopedics;  Laterality: Left;   Family History  Problem Relation Age of Onset   Diabetes Father    Heart disease Father    Cancer Maternal Aunt        Breast,  metastatic   Breast cancer Maternal Aunt 60   Cancer Maternal Grandmother        ovarian   Dementia Mother    Thyroid disease Neg Hx    Social History   Socioeconomic History   Marital status: Married    Spouse name: Not on file   Number of children: Not on file   Years of education: Not on file   Highest education level: Not on file  Occupational History   Not on file  Tobacco Use   Smoking status: Never Smoker   Smokeless tobacco: Never Used  Vaping Use    Vaping Use: Never used  Substance and Sexual Activity   Alcohol use: Yes    Comment: occasional   Drug use: No   Sexual activity: Not Currently  Other Topics Concern   Not on file  Social History Narrative   Not on file   Social Determinants of Health   Financial Resource Strain: Low Risk    Difficulty of Paying Living Expenses: Not hard at all  Food Insecurity: No Food Insecurity   Worried About Programme researcher, broadcasting/film/video in the Last Year: Never true   Ran Out of Food in the Last Year: Never true  Transportation Needs: No Transportation Needs   Lack of Transportation (Medical): No   Lack of Transportation (Non-Medical): No  Physical Activity:    Days of Exercise per Week:    Minutes of Exercise per Session:   Stress: No Stress Concern Present   Feeling of Stress : Not at all  Social Connections: Socially Integrated   Frequency of Communication with Friends and Family: More than three times a week   Frequency of Social Gatherings with Friends and Family: Not on file   Attends Religious Services: More than 4 times per year   Active Member of [employer] or Organizations: Yes   Attends Engineer, structural: More than 4 times per year   Marital Status: Married    Tobacco Counseling Counseling given: Not Answered   Clinical Intake:  Pre-visit preparation completed: Yes        Diabetes: No  How often do you need to have someone help you when you read instructions, pamphlets, or other written materials from your doctor or pharmacy?: 1 - Never Interpreter Needed?: No      Activities of Daily Living In your present state of health, do you have any difficulty performing the following activities: 08/07/2020  Hearing? N  Vision? N  Difficulty concentrating or making decisions? N  Walking or climbing stairs? N  Dressing or bathing? N  Doing errands, shopping? N  Preparing Food and eating ? N  Using the Toilet? N  In the past six months, have you accidently leaked  urine? N  Do you have problems with loss of bowel control? N  Managing your Medications? N  Managing your Finances? N  Housekeeping or managing your Housekeeping? N  Some recent data might be hidden    Patient Care Team: Sherlene Shams, MD as PCP - General (Internal Medicine) Sherlene Shams, MD (Internal Medicine)  Indicate any recent Medical Services you may have received from other than [facility] providers in the past year (date may be approximate).     Assessment:   This is a routine wellness examination for Brooke Tucker.  I connected with Soliel today by telephone and verified that I am speaking with the correct person using two identifiers. Location patient: home Location provider: work Persons participating in  the virtual visit: patient, nurse.    I discussed the limitations, risks, security and privacy concerns of performing an evaluation and management service by telephone and the availability of in person appointments. The patient expressed understanding and verbally consented to this telephonic visit.    Interactive audio and video telecommunications were attempted between this provider and patient, however failed, due to patient having technical difficulties OR patient did not have access to video capability.  We continued and completed visit with audio only.  Some vital signs may be absent or patient reported.   Hearing/Vision screen  Hearing Screening   125Hz  250Hz  500Hz  1000Hz  2000Hz  3000Hz  4000Hz  6000Hz  8000Hz   Right ear:           Left ear:           Comments: Patient is able to hear conversational tones without difficulty.  No issues reported.    Vision Screening Comments: Wears corrective lenses  Visual acuity not assessed, virtual visit. They have seen their ophthalmologist.  Dietary issues and exercise activities discussed: Current Exercise Habits: Home exercise routine, Type of exercise: calisthenics, Time (Minutes): 60, Frequency (Times/Week): 3, Weekly Exercise  (Minutes/Week): 180, Intensity: MildHealthy diet Good water intake Caffeine: 1-2 cups of coffee  Goals       Patient Stated     Increase physical activity (pt-stated)      I want to increase my daily steps Swim more for exercise Join senior classes       Weight (lb) < 169 lb (76.7 kg) (pt-stated)      Lose 10-15 lb        Depression Screen PHQ 2/9 Scores 08/07/2020 08/07/2019 02/11/2018 03/19/2015 10/20/2012  PHQ - 2 Score 0 0 0 0 0  PHQ- 9 Score - - 3 - -    Fall Risk Fall Risk  08/07/2020 10/09/2019 08/07/2019 02/11/2018 02/11/2018  Falls in the past year? 0 0 0 No No  Number falls in past yr: 0 - - - -  Follow up Falls evaluation completed Falls evaluation completed - - -   Handrails in use when climbing stairs? Yes  Home free of loose throw rugs in walkways, pet beds, electrical cords, etc? Yes  Adequate lighting in your home to reduce risk of falls? Yes   ASSISTIVE DEVICES UTILIZED TO PREVENT FALLS:  Life alert? No  Use of a cane, walker or w/c? Yes , as needed Grab bars in the bathroom? No  Shower chair or bench in shower? No  Elevated toilet seat or a handicapped toilet? No   TIMED UP AND GO:  Was the test performed? No . Virtual visit.   Cognitive Function:  Patient is alert and oriented x3. Denies difficulty with memory loss, focusing, concentrating, making decision.   6CIT Screen 08/07/2019  What Year? 0 points  What month? 0 points  What time? 0 points  Count back from 20 0 points  Months in reverse 0 points    Immunizations Immunization History  Administered Date(s) Administered   Fluad Quad(high Dose 65+) 09/26/2019   Influenza, High Dose Seasonal PF 10/14/2018   Influenza-Unspecified 09/14/2013, 09/18/2014, 10/16/2015, 10/21/2017   Moderna SARS-COVID-2 Vaccination 01/23/2020, 02/20/2020   Pneumococcal Conjugate-13 03/19/2015   Pneumococcal Polysaccharide-23 10/23/2010, 02/11/2018   Tdap 10/20/2012   Health Maintenance Health Maintenance    Topic Date Due   Fecal DNA (Cologuard)  Never done   INFLUENZA VACCINE  07/28/2020   MAMMOGRAM  09/28/2021   TETANUS/TDAP  10/20/2022   DEXA SCAN  Completed   COVID-19 Vaccine  Completed   Hepatitis C Screening  Completed   PNA vac Low Risk Adult  Completed    Mammogram - scheduled in 2 weeks with [place].   Colonoscopy deferred. Cologuard deferred per patient request to wait until in office visit with pcp.   Dental Screening: Recommended annual dental exams for proper oral hygiene  Community Resource Referral / Chronic Care Management: CRR required this visit?  No   CCM required this visit?  No      Plan:   Keep all routine maintenance appointments.   Follow up 10/14/20 @ 10:00  I have personally reviewed and noted the following in the patient's chart:   Medical and social history Use of alcohol, tobacco or illicit drugs  Current medications and supplements Functional ability and status Nutritional status Physical activity Advanced directives List of other physicians Hospitalizations, surgeries, and ER visits in previous 12 months Vitals Screenings to include cognitive, depression, and falls Referrals and appointments  In addition, I have reviewed and discussed with patient certain preventive protocols, quality metrics, and best practice recommendations. A written personalized care plan for preventive services as well as general preventive health recommendations were provided to patient via mychart.     OBrien-Blaney, Rindi Beechy L, LPN   6/80/8811    I have reviewed the above information and agree with above.   Duncan Dull, MD

## 2020-08-26 ENCOUNTER — Other Ambulatory Visit: Payer: Self-pay

## 2020-08-26 ENCOUNTER — Ambulatory Visit
Admission: RE | Admit: 2020-08-26 | Discharge: 2020-08-26 | Disposition: A | Discharge status: Home / Self Care | Payer: Medicare Other | Source: Ambulatory Visit | Attending: Internal Medicine | Admitting: Internal Medicine

## 2020-08-26 DIAGNOSIS — R921 Mammographic calcification found on diagnostic imaging of breast: Secondary | ICD-10-CM | POA: Diagnosis present

## 2020-08-28 ENCOUNTER — Other Ambulatory Visit: Payer: Self-pay | Admitting: Internal Medicine

## 2020-10-14 ENCOUNTER — Encounter: Payer: Self-pay | Admitting: Internal Medicine

## 2020-10-14 ENCOUNTER — Telehealth (INDEPENDENT_AMBULATORY_CARE_PROVIDER_SITE_OTHER): Payer: Medicare Other | Admitting: Internal Medicine

## 2020-10-14 ENCOUNTER — Ambulatory Visit: Payer: Medicare Other | Admitting: Internal Medicine

## 2020-10-14 ENCOUNTER — Other Ambulatory Visit: Payer: Self-pay

## 2020-10-14 VITALS — HR 72 | Resp 18

## 2020-10-14 DIAGNOSIS — J069 Acute upper respiratory infection, unspecified: Secondary | ICD-10-CM | POA: Diagnosis not present

## 2020-10-14 DIAGNOSIS — R0981 Nasal congestion: Secondary | ICD-10-CM

## 2020-10-14 LAB — POCT INFLUENZA A/B
Influenza A, POC: POSITIVE — AB
Influenza B, POC: NEGATIVE

## 2020-10-14 MED ORDER — ALPRAZOLAM 0.5 MG PO TABS: 0.5000 mg | ORAL_TABLET | Freq: Every evening | ORAL | 0 refills | Status: DC | PRN

## 2020-10-14 MED ORDER — AMOXICILLIN-POT CLAVULANATE 875-125 MG PO TABS: 1.0000 | ORAL_TABLET | Freq: Two times a day (BID) | ORAL | 0 refills | Status: DC

## 2020-10-14 MED ORDER — PREDNISONE 10 MG PO TABS: ORAL_TABLET | ORAL | 0 refills | Status: DC

## 2020-10-14 MED ORDER — OSELTAMIVIR PHOSPHATE 75 MG PO CAPS: 75.0000 mg | ORAL_CAPSULE | Freq: Two times a day (BID) | ORAL | 0 refills | Status: DC

## 2020-10-14 NOTE — Assessment & Plan Note (Addendum)
Her symptoms do not suggest that she has a bacterial infection  AT THIS TIME.  Testing for influenza was positive for Influenza A ,  covid 19 in process.. she has been vaccinated   [place] until results are known. Will refer to MoAb if positive given her age and asthma history.    . Tamiflu sent to pharmacy

## 2020-10-14 NOTE — Patient Instructions (Addendum)
Your  symptoms do NOT suggest that you have a  bacterial infection and therefore YOU DO NOT NEED to take an antibiotic at this time.  I'll call in a prednisone taper and an antibiotic to be started only if you develop:   fever,  Facial /ear pain,  Or blood streaked green nasal discharge   If your COVID TEST IS POSITIVE:  Check your pulse ox readings with exertion IF YOU DEVELOP SHORTNESS OF BREATH (YOU CAN DO THIS WITH A PULSE OXIMETER AVAILABLE  FROM [facility] OR ANY PHARMACY) .  If they drop to 90% or below and do not improve with rest, go to the nearest ER   Use guaifenesin (Mucinex ) as needed for  thick mucus  Use benadryl 12.5 mg ot 25 mg to dry up nose  Use dextromethorphan  (robitussin) to quiet down the cough   A collective team  of  pulmonary and critical care specialists at [facility] have worked to open an outpatient infusion center to provide the approved monoclonal antibody therapy medications to COVID-19 positive community members who are at risk for severe complications    I will send  your name in as a referral.    You should continue your self imposed quantarine until you can answer "yes" to ALL 3 of the conditions below:   1) Your symptoms (fever, cough, shortness of breath) started 10  or more days ago   2) Your body temperature  has been normal for at least 72 hours (WITHOUT the use of any tylenol, motrin or aleve) . Normal is < 100.4 Farenheit  3) your other  flu  like symptoms symptoms are getting better.

## 2020-10-14 NOTE — Progress Notes (Signed)
Telephone Note   This visit type was conducted due to national recommendations for restrictions regarding the COVID-19 pandemic (e.g. social distancing).  This format is felt to be most appropriate for this patient at this time.  All issues noted in this document were discussed and addressed.  No physical exam was performed (except for noted visual exam findings with Video Visits).   I connected with@ on 10/14/20 at  1:30 PM EDT by  telephone and verified that I am speaking with the correct person using two identifiers. Location patient: home Location provider: work or home office Persons participating in the virtual visit: patient, provider  I discussed the limitations, risks, security and privacy concerns of performing an evaluation and management service by telephone and the availability of in person appointments. I also discussed with the patient that there may be a patient responsible charge related to this service. The patient expressed understanding and agreed to proceed.  Reason for visit: acute URI  HPI:  69 yr old female  With history of asthma, vaccinated with Moderna x 2 ,  Was scheduled for CPE but changed to telephone visit due to new onset URI symptoms which started on Thursday with a severe diffuse  headache,  Neck stiffness ,  Started coughing sneezing and having rhinitis on Friday .  Headache and neck stiffness have improved  But has had some swollen lymph nodes on the left.  Hoarseness,  No pharyngitis.  Some chills  But has not taken temp.  No nausea or diarrhea,  No anosmia,  Nasal drainage is clear .   No recent travel or known exposure.  Shopped at [employer] masked and all church  activities have  masked     ROS: See pertinent positives and negatives per HPI.  Past Medical History:  Diagnosis Date  . Arthritis   . Dysrhythmia    tachycardia   . Hypertension   . Hyperthyroidism   . Multinodular goiter   . Ovarian cyst July 2008   laparscopic biopsy normal  .  Rheumatoid arthritis(714.0)    managed by Lindon Romp with MTX    Past Surgical History:  Procedure Laterality Date  . BREAST BIOPSY Left 2012   fibroadenoma  . BREAST BIOPSY Left 10/12/2019   Affirm Bx- Ribbon clip- fibroadenoma  . BREAST BIOPSY Right 10/12/2019   Affirm Bx- X-clip- fibroadenoma  . COMBINED HYSTEROSCOPY DIAGNOSTIC / D&C  July 2008   [facility]  . ESOPHAGOGASTRODUODENOSCOPY  July 2008   normal  . EXCISION MORTON'S NEUROMA     Left foot  . KNEE ARTHROSCOPY Left 01/07/2016   Procedure: ARTHROSCOPY KNEE, lateral release, partial lateral menisectomy, excision plica;  Surgeon: Hessie Knows, MD;  Location: [facility] ORS;  Service: Orthopedics;  Laterality: Left;  . LEFT HEART CATH AND CORONARY ANGIOGRAPHY Left 02/03/2018   Procedure: LEFT HEART CATH AND CORONARY ANGIOGRAPHY;  Surgeon: Isaias Cowman, MD;  Location: [facility];  Service: Cardiovascular;  Laterality: Left;  . METATARSAL OSTEOTOMY  dec 2012   right foot, 5th MT  ([facility])  . TOTAL KNEE ARTHROPLASTY Left 06/28/2018   Procedure: TOTAL KNEE ARTHROPLASTY;  Surgeon: Hessie Knows, MD;  Location: [facility] ORS;  Service: Orthopedics;  Laterality: Left;    Family History  Problem Relation Age of Onset  . Diabetes Father   . Heart disease Father   . Cancer Maternal Aunt        Breast, metastatic  . Breast cancer Maternal Aunt 60  . Cancer Maternal Grandmother  ovarian  . Dementia Mother   . Thyroid disease Neg Hx     SOCIAL HX:  reports that she has never smoked. She has never used smokeless tobacco. She reports current alcohol use. She reports that she does not use drugs.   Current Outpatient Medications:  .  ALPRAZolam (XANAX) 0.5 MG tablet, Take 1 tablet (0.5 mg total) by mouth at bedtime as needed for anxiety., Disp: 20 tablet, Rfl: 0 .  diphenhydramine-acetaminophen (TYLENOL PM) 25-500 MG TABS tablet, Take 1 tablet by mouth at bedtime., Disp: , Rfl:  .  metoprolol succinate (TOPROL-XL) 25 MG 24 hr  tablet, Take 50 mg by mouth every morning. , Disp: , Rfl:  .  TRELEGY ELLIPTA 100-62.5-25 MCG/INH AEPB, , Disp: , Rfl:  .  amoxicillin-clavulanate (AUGMENTIN) 875-125 MG tablet, Take 1 tablet by mouth 2 (two) times daily., Disp: 14 tablet, Rfl: 0 .  predniSONE (DELTASONE) 10 MG tablet, 6 tablets on Day 1 , then reduce by 1 tablet daily until gone, Disp: 21 tablet, Rfl: 0  EXAM:   General impression: alert, cooperative and articulate.  No signs of being in distress  Lungs: speech is fluent sentence length suggests that patient is not short of breath and not punctuated by cough, sneezing or sniffing. Marland Kitchen   Psych: affect normal.  speech is articulate and non pressured .  Denies suicidal thoughts   ASSESSMENT AND PLAN:  Discussed the following assessment and plan:  Sinus congestion - Plan: Novel Coronavirus, NAA ([facility]), POCT Influenza A/B  Acute URI  Acute URI Her symptoms do not suggest that she has a bacterial infection  AT THIS TIME.  Testing for influenza and covid 19 in process.. she has been vaccinated   [place] until results are known.  And quarantine for ten days if positive and all criteria met. Will refer to MoAb if positive given her age and asthma history.    adding antibiotics and prednisone if symptoms worsen    I discussed the assessment and treatment plan with the patient. The patient was provided an opportunity to ask questions and all were answered. The patient agreed with the plan and demonstrated an understanding of the instructions.   The patient was advised to call back or seek an in-person evaluation if the symptoms worsen or if the condition fails to improve as anticipated.  I provided 20 minutes of non-face-to-face time during this encounter.   Crecencio Mc, MD

## 2020-10-16 LAB — SARS-COV-2, NAA 2 DAY TAT

## 2020-10-16 LAB — NOVEL CORONAVIRUS, NAA: SARS-CoV-2, NAA: NOT DETECTED

## 2020-10-17 NOTE — Progress Notes (Signed)
.  I hope you are feeling better.  Your COVID TEST was nNEGATIVE  Regards,   Duncan Dull, MD

## 2020-11-01 ENCOUNTER — Other Ambulatory Visit: Payer: Self-pay | Admitting: Orthopedic Surgery

## 2020-11-01 ENCOUNTER — Other Ambulatory Visit: Payer: Medicare Other

## 2020-11-04 ENCOUNTER — Encounter
Admission: RE | Admit: 2020-11-04 | Discharge: 2020-11-04 | Disposition: A | Discharge status: Home / Self Care | Payer: Medicare Other | Source: Ambulatory Visit | Attending: Orthopedic Surgery | Admitting: Orthopedic Surgery

## 2020-11-04 ENCOUNTER — Other Ambulatory Visit: Payer: Self-pay

## 2020-11-04 ENCOUNTER — Encounter: Payer: Self-pay | Admitting: Orthopedic Surgery

## 2020-11-04 DIAGNOSIS — Z01818 Encounter for other preprocedural examination: Secondary | ICD-10-CM | POA: Diagnosis present

## 2020-11-04 DIAGNOSIS — I10 Essential (primary) hypertension: Secondary | ICD-10-CM | POA: Insufficient documentation

## 2020-11-04 DIAGNOSIS — Z20822 Contact with and (suspected) exposure to covid-19: Secondary | ICD-10-CM | POA: Diagnosis not present

## 2020-11-04 HISTORY — DX: Unspecified asthma, uncomplicated: J45.909

## 2020-11-04 LAB — BASIC METABOLIC PANEL
Anion gap: 12 (ref 5–15)
BUN: 10 mg/dL (ref 8–23)
CO2: 25 mmol/L (ref 22–32)
Calcium: 9.1 mg/dL (ref 8.9–10.3)
Chloride: 103 mmol/L (ref 98–111)
Creatinine, Ser: 0.66 mg/dL (ref 0.44–1.00)
GFR, Estimated: >60 mL/min (ref >60)
Glucose, Bld: 92 mg/dL (ref 70–99)
Potassium: 3.9 mmol/L (ref 3.5–5.1)
Sodium: 140 mmol/L (ref 135–145)

## 2020-11-04 LAB — CBC
HCT: 42.3 % (ref 36.0–46.0)
Hemoglobin: 13.8 g/dL (ref 12.0–15.0)
MCH: 30.5 pg (ref 26.0–34.0)
MCHC: 32.6 g/dL (ref 30.0–36.0)
MCV: 93.4 fL (ref 80.0–100.0)
Platelets: 234 10*3/uL (ref 150–400)
RBC: 4.53 MIL/uL (ref 3.87–5.11)
RDW: 13.2 % (ref 11.5–15.5)
WBC: 7.6 10*3/uL (ref 4.0–10.5)
nRBC: 0 % (ref 0.0–0.2)

## 2020-11-04 LAB — SARS CORONAVIRUS 2 (TAT 6-24 HRS): SARS Coronavirus 2: NEGATIVE

## 2020-11-04 NOTE — Patient Instructions (Signed)
Your procedure is scheduled on: 11/05/20 Report to DAY SURGERY DEPARTMENT LOCATED ON 2ND FLOOR MEDICAL MALL ENTRANCE. To find out your arrival time please call [phone] between 1PM - 3PM on 11/04/20.  Remember: Instructions that are not followed completely may result in serious medical risk, up to and including death, or upon the discretion of your surgeon and anesthesiologist your surgery may need to be rescheduled.     _X__ 1. Do not eat food after midnight the night before your procedure.                 No gum chewing or hard candies. You may drink clear liquids up to 2 hours                 before you are scheduled to arrive for your surgery- DO not drink clear                 liquids within 2 hours of the start of your surgery.                 Clear Liquids include:  water, apple juice without pulp, clear carbohydrate                 drink such as Clearfast or Gatorade, Black Coffee or Tea (Do not add                 anything to coffee or tea). Diabetics water only  __X__2.  On the morning of surgery brush your teeth with toothpaste and water, you                 may rinse your mouth with mouthwash if you wish.  Do not swallow any              toothpaste of mouthwash.     _X__ 3.  No Alcohol for 24 hours before or after surgery.   _X__ 4.  Do Not Smoke or use e-cigarettes For 24 Hours Prior to Your Surgery.                 Do not use any chewable tobacco products for at least 6 hours prior to                 surgery.  ____  5.  Bring all medications with you on the day of surgery if instructed.   __X__  6.  Notify your doctor if there is any change in your medical condition      (cold, fever, infections).     Do not wear jewelry, make-up, hairpins, clips or nail polish. Do not wear lotions, powders, or perfumes.  Do not shave 48 hours prior to surgery. Men may shave face and neck. Do not bring valuables to the hospital.    [facility] is not responsible for any belongings or  valuables.  Contacts, dentures/partials or body piercings may not be worn into surgery. Bring a case for your contacts, glasses or hearing aids, a denture cup will be supplied. Leave your suitcase in the car. After surgery it may be brought to your room. For patients admitted to the hospital, discharge time is determined by your treatment team.   Patients discharged the day of surgery will not be allowed to drive home.   Please read over the following fact sheets that you were given:   MRSA Information  __X__ Take these medicines the morning of surgery with A SIP OF WATER:  1. metoprolol succinate (TOPROL-XL)   2.   3.   4.  5.  6.  ____ Fleet Enema (as directed)   __X__ Use CHG Soap/SAGE wipes as directed  __X__ Use inhalers on the day of surgery  ____ Stop metformin/Janumet/Farxiga 2 days prior to surgery    ____ Take 1/2 of usual insulin dose the night before surgery. No insulin the morning          of surgery.   ____ Stop Blood Thinners Coumadin/Plavix/Xarelto/Pleta/Pradaxa/Eliquis/Effient/Aspirin  on   Or contact your Surgeon, Cardiologist or Medical Doctor regarding  ability to stop your blood thinners  __X__ Stop Anti-inflammatories 7 days before surgery such as Advil, Ibuprofen, Motrin,  BC or Goodies Powder, Naprosyn, Naproxen, Aleve, Aspirin    __X__ Stop all herbal supplements, fish oil or vitamin E until after surgery.    ____ Bring C-Pap to the hospital.

## 2020-11-05 ENCOUNTER — Ambulatory Visit: Payer: Medicare Other

## 2020-11-05 ENCOUNTER — Encounter
Admission: RE | Disposition: A | Discharge status: Home / Self Care | Payer: Self-pay | Source: Ambulatory Visit | Attending: Orthopedic Surgery

## 2020-11-05 ENCOUNTER — Ambulatory Visit: Payer: Medicare Other | Admitting: Urgent Care

## 2020-11-05 ENCOUNTER — Other Ambulatory Visit: Payer: Self-pay

## 2020-11-05 ENCOUNTER — Ambulatory Visit
Admission: RE | Admit: 2020-11-05 | Discharge: 2020-11-05 | Disposition: A | Discharge status: Home / Self Care | Payer: Medicare Other | Source: Ambulatory Visit | Attending: Orthopedic Surgery | Admitting: Orthopedic Surgery

## 2020-11-05 ENCOUNTER — Encounter: Payer: Self-pay | Admitting: Orthopedic Surgery

## 2020-11-05 DIAGNOSIS — S52551A Other extraarticular fracture of lower end of right radius, initial encounter for closed fracture: Secondary | ICD-10-CM | POA: Diagnosis present

## 2020-11-05 DIAGNOSIS — W109XXA Fall (on) (from) unspecified stairs and steps, initial encounter: Secondary | ICD-10-CM | POA: Insufficient documentation

## 2020-11-05 DIAGNOSIS — Z8262 Family history of osteoporosis: Secondary | ICD-10-CM | POA: Insufficient documentation

## 2020-11-05 DIAGNOSIS — Z8249 Family history of ischemic heart disease and other diseases of the circulatory system: Secondary | ICD-10-CM | POA: Diagnosis not present

## 2020-11-05 DIAGNOSIS — M069 Rheumatoid arthritis, unspecified: Secondary | ICD-10-CM | POA: Diagnosis not present

## 2020-11-05 DIAGNOSIS — Z8781 Personal history of (healed) traumatic fracture: Secondary | ICD-10-CM

## 2020-11-05 DIAGNOSIS — Z885 Allergy status to narcotic agent status: Secondary | ICD-10-CM | POA: Insufficient documentation

## 2020-11-05 DIAGNOSIS — Z79899 Other long term (current) drug therapy: Secondary | ICD-10-CM | POA: Diagnosis not present

## 2020-11-05 DIAGNOSIS — Z886 Allergy status to analgesic agent status: Secondary | ICD-10-CM | POA: Diagnosis not present

## 2020-11-05 DIAGNOSIS — Z833 Family history of diabetes mellitus: Secondary | ICD-10-CM | POA: Insufficient documentation

## 2020-11-05 DIAGNOSIS — Z888 Allergy status to other drugs, medicaments and biological substances status: Secondary | ICD-10-CM | POA: Diagnosis not present

## 2020-11-05 DIAGNOSIS — Z9889 Other specified postprocedural states: Secondary | ICD-10-CM

## 2020-11-05 HISTORY — DX: Nonrheumatic aortic (valve) stenosis: I35.0

## 2020-11-05 HISTORY — PX: OPEN REDUCTION INTERNAL FIXATION (ORIF) DISTAL RADIAL FRACTURE: SHX5989

## 2020-11-05 HISTORY — DX: Cardiomyopathy, unspecified: I42.9

## 2020-11-05 SURGERY — OPEN REDUCTION INTERNAL FIXATION (ORIF) DISTAL RADIUS FRACTURE
Anesthesia: General | Laterality: Right

## 2020-11-05 MED ORDER — CEFAZOLIN SODIUM-DEXTROSE 2-4 GM/100ML-% IV SOLN
2.0000 g | INTRAVENOUS | Status: AC
  Administered 2020-11-05: 2 g via INTRAVENOUS

## 2020-11-05 MED ORDER — KETOROLAC TROMETHAMINE 30 MG/ML IJ SOLN
INTRAMUSCULAR | Status: AC
  Filled 2020-11-05: qty 1

## 2020-11-05 MED ORDER — DEXAMETHASONE SODIUM PHOSPHATE 10 MG/ML IJ SOLN
INTRAMUSCULAR | Status: AC
  Filled 2020-11-05: qty 1

## 2020-11-05 MED ORDER — FENTANYL CITRATE (PF) 100 MCG/2ML IJ SOLN
INTRAMUSCULAR | Status: DC | PRN
  Administered 2020-11-05: 50 ug via INTRAVENOUS
  Administered 2020-11-05 (×2): 25 ug via INTRAVENOUS

## 2020-11-05 MED ORDER — EPHEDRINE 5 MG/ML INJ
INTRAVENOUS | Status: AC
  Filled 2020-11-05: qty 10

## 2020-11-05 MED ORDER — EPHEDRINE SULFATE 50 MG/ML IJ SOLN
INTRAMUSCULAR | Status: DC | PRN
  Administered 2020-11-05: 10 mg via INTRAVENOUS
  Administered 2020-11-05: 15 mg via INTRAVENOUS

## 2020-11-05 MED ORDER — HYDROCODONE-ACETAMINOPHEN 5-325 MG PO TABS: 1.0000 | ORAL_TABLET | Freq: Four times a day (QID) | ORAL | 0 refills | Status: DC | PRN

## 2020-11-05 MED ORDER — ACETAMINOPHEN 10 MG/ML IV SOLN: 1000.0000 mg | Freq: Once | INTRAVENOUS | Status: DC | PRN

## 2020-11-05 MED ORDER — DEXMEDETOMIDINE (PRECEDEX) IN NS 20 MCG/5ML (4 MCG/ML) IV SYRINGE
PREFILLED_SYRINGE | INTRAVENOUS | Status: AC
  Filled 2020-11-05: qty 5

## 2020-11-05 MED ORDER — MIDAZOLAM HCL 5 MG/5ML IJ SOLN
INTRAMUSCULAR | Status: DC | PRN
  Administered 2020-11-05: 2 mg via INTRAVENOUS

## 2020-11-05 MED ORDER — FENTANYL CITRATE (PF) 100 MCG/2ML IJ SOLN
25.0000 ug | INTRAMUSCULAR | Status: DC | PRN
  Administered 2020-11-05 (×4): 50 ug via INTRAVENOUS

## 2020-11-05 MED ORDER — FENTANYL CITRATE (PF) 100 MCG/2ML IJ SOLN
INTRAMUSCULAR | Status: AC
  Filled 2020-11-05: qty 2

## 2020-11-05 MED ORDER — ORAL CARE MOUTH RINSE: 15.0000 mL | Freq: Once | OROMUCOSAL | Status: AC

## 2020-11-05 MED ORDER — DEXMEDETOMIDINE (PRECEDEX) IN NS 20 MCG/5ML (4 MCG/ML) IV SYRINGE
PREFILLED_SYRINGE | INTRAVENOUS | Status: DC | PRN
  Administered 2020-11-05: 8 ug via INTRAVENOUS
  Administered 2020-11-05 (×2): 4 ug via INTRAVENOUS

## 2020-11-05 MED ORDER — ONDANSETRON HCL 4 MG/2ML IJ SOLN
INTRAMUSCULAR | Status: AC
  Filled 2020-11-05: qty 2

## 2020-11-05 MED ORDER — ONDANSETRON HCL 4 MG/2ML IJ SOLN
INTRAMUSCULAR | Status: DC | PRN
  Administered 2020-11-05: 4 mg via INTRAVENOUS

## 2020-11-05 MED ORDER — LIDOCAINE HCL (PF) 2 % IJ SOLN
INTRAMUSCULAR | Status: DC | PRN
  Administered 2020-11-05: 80 mg

## 2020-11-05 MED ORDER — LACTATED RINGERS IV SOLN: INTRAVENOUS | Status: DC

## 2020-11-05 MED ORDER — PROPOFOL 10 MG/ML IV BOLUS
INTRAVENOUS | Status: DC | PRN
  Administered 2020-11-05: 40 mg via INTRAVENOUS
  Administered 2020-11-05: 120 mg via INTRAVENOUS
  Administered 2020-11-05: 40 mg via INTRAVENOUS

## 2020-11-05 MED ORDER — ACETAMINOPHEN 10 MG/ML IV SOLN
INTRAVENOUS | Status: DC | PRN
  Administered 2020-11-05: 1000 mg via INTRAVENOUS

## 2020-11-05 MED ORDER — MIDAZOLAM HCL 2 MG/2ML IJ SOLN
INTRAMUSCULAR | Status: AC
  Filled 2020-11-05: qty 2

## 2020-11-05 MED ORDER — CEFAZOLIN SODIUM-DEXTROSE 2-4 GM/100ML-% IV SOLN
INTRAVENOUS | Status: AC
  Filled 2020-11-05: qty 100

## 2020-11-05 MED ORDER — DEXAMETHASONE SODIUM PHOSPHATE 10 MG/ML IJ SOLN
INTRAMUSCULAR | Status: DC | PRN
  Administered 2020-11-05: 10 mg via INTRAVENOUS

## 2020-11-05 MED ORDER — CHLORHEXIDINE GLUCONATE 0.12 % MT SOLN
15.0000 mL | Freq: Once | OROMUCOSAL | Status: AC
  Administered 2020-11-05: 15 mL via OROMUCOSAL

## 2020-11-05 MED ORDER — NEOMYCIN-POLYMYXIN B GU 40-200000 IR SOLN
Status: DC | PRN
  Administered 2020-11-05: 2 mL

## 2020-11-05 MED ORDER — CHLORHEXIDINE GLUCONATE 0.12 % MT SOLN
OROMUCOSAL | Status: AC
  Filled 2020-11-05: qty 15

## 2020-11-05 MED ORDER — ONDANSETRON 4 MG PO TBDP: 4.0000 mg | ORAL_TABLET | Freq: Three times a day (TID) | ORAL | 0 refills | Status: DC | PRN

## 2020-11-05 MED ORDER — ONDANSETRON HCL 4 MG/2ML IJ SOLN: 4.0000 mg | Freq: Once | INTRAMUSCULAR | Status: DC | PRN

## 2020-11-05 SURGICAL SUPPLY — 43 items
APL PRP STRL LF DISP 70% ISPRP (MISCELLANEOUS) ×1
BIT DRILL 2 FAST STEP (BIT) ×3 IMPLANT
BIT DRILL 2.5X4 QC (BIT) ×3 IMPLANT
BNDG ELASTIC 4X5.8 VLCR STR LF (GAUZE/BANDAGES/DRESSINGS) ×3 IMPLANT
CANISTER SUCT 1200ML W/VALVE (MISCELLANEOUS) ×3 IMPLANT
CHLORAPREP W/TINT 26 (MISCELLANEOUS) ×3 IMPLANT
COVER WAND RF STERILE (DRAPES) ×3 IMPLANT
CUFF TOURN SGL QUICK 18X4 (TOURNIQUET CUFF) ×2 IMPLANT
DRAPE FLUOR MINI C-ARM 54X84 (DRAPES) ×3 IMPLANT
ELECT REM PT RETURN 9FT ADLT (ELECTROSURGICAL) ×3
ELECTRODE REM PT RTRN 9FT ADLT (ELECTROSURGICAL) ×1 IMPLANT
GAUZE SPONGE 4X4 12PLY STRL (GAUZE/BANDAGES/DRESSINGS) ×3 IMPLANT
GAUZE XEROFORM 1X8 LF (GAUZE/BANDAGES/DRESSINGS) ×3 IMPLANT
GLOVE SURG SYN 9.0  PF PI (GLOVE) ×3
GLOVE SURG SYN 9.0 PF PI (GLOVE) ×1 IMPLANT
GOWN SRG 2XL LVL 4 RGLN SLV (GOWNS) ×1 IMPLANT
GOWN STRL NON-REIN 2XL LVL4 (GOWNS) ×3
GOWN STRL REUS W/ TWL LRG LVL3 (GOWN DISPOSABLE) ×1 IMPLANT
GOWN STRL REUS W/TWL LRG LVL3 (GOWN DISPOSABLE) ×3
K-WIRE 1.6 (WIRE) ×3
K-WIRE FX5X1.6XNS BN SS (WIRE) ×1
KIT TURNOVER KIT A (KITS) ×3 IMPLANT
KWIRE FX5X1.6XNS BN SS (WIRE) IMPLANT
MANIFOLD NEPTUNE II (INSTRUMENTS) ×3 IMPLANT
NDL FILTER BLUNT 18X1 1/2 (NEEDLE) ×1 IMPLANT
NEEDLE FILTER BLUNT 18X 1/2SAF (NEEDLE) ×2
NEEDLE FILTER BLUNT 18X1 1/2 (NEEDLE) ×1 IMPLANT
NS IRRIG 500ML POUR BTL (IV SOLUTION) ×3 IMPLANT
PACK EXTREMITY (MISCELLANEOUS) ×3 IMPLANT
PAD CAST CTTN 4X4 STRL (SOFTGOODS) ×1 IMPLANT
PADDING CAST COTTON 4X4 STRL (SOFTGOODS) ×3
PEG SUBCHONDRAL SMOOTH 2.0X14 (Peg) ×3 IMPLANT
PEG SUBCHONDRAL SMOOTH 2.0X18 (Peg) ×4 IMPLANT
PEG SUBCHONDRAL SMOOTH 2.0X20 (Peg) ×9 IMPLANT
PLATE SHORT 21.6X48.9 NRRW RT (Plate) ×3 IMPLANT
SCALPEL PROTECTED #15 DISP (BLADE) ×6 IMPLANT
SCREW CORT 3.5X10 LNG (Screw) ×9 IMPLANT
SPLINT CAST 1 STEP 3X12 (MISCELLANEOUS) ×3 IMPLANT
SUT ETHILON 4-0 (SUTURE) ×3
SUT ETHILON 4-0 FS2 18XMFL BLK (SUTURE) ×1
SUT VICRYL 3-0 27IN (SUTURE) ×3 IMPLANT
SUTURE ETHLN 4-0 FS2 18XMF BLK (SUTURE) ×1 IMPLANT
SYR 3ML LL SCALE MARK (SYRINGE) ×3 IMPLANT

## 2020-11-05 NOTE — Transfer of Care (Signed)
Immediate Anesthesia Transfer of Care Note  Patient: Brooke Tucker  Procedure(s) Performed: OPEN REDUCTION INTERNAL FIXATION (ORIF) DISTAL RADIAL FRACTURE (Right )  Patient Location: PACU  Anesthesia Type:General  Level of Consciousness: awake and alert   Airway & Oxygen Therapy: Patient Spontanous Breathing and Patient connected to face mask oxygen  Post-op Assessment: Report given to RN and Post -op Vital signs reviewed and stable  Post vital signs: Reviewed  Last Vitals:  Vitals Value Taken Time  BP    Temp    Pulse    Resp    SpO2      Last Pain:  Vitals:   11/05/20 0911  TempSrc: Oral  PainSc: 0-No pain         Complications: No complications documented.

## 2020-11-05 NOTE — Op Note (Signed)
11/05/2020  2:27 PM  PATIENT:  Brooke Tucker  69 y.o. female  PRE-OPERATIVE DIAGNOSIS:  Other closed extra-articular fracture of distal end of right radius, initial encounter S52.551A  POST-OPERATIVE DIAGNOSIS:  racture of distal end of right radius, initial encounter S52.551A  PROCEDURE:  Procedure(s): OPEN REDUCTION INTERNAL FIXATION (ORIF) DISTAL RADIAL FRACTURE (Right)  SURGEON: Leitha Schuller, MD  ASSISTANTS: None  ANESTHESIA:   general  EBL:  Total I/O In: 800 [I.V.:600; IV Piggyback:200] Out: 20 [Blood:20]  BLOOD ADMINISTERED:none  DRAINS: none   LOCAL MEDICATIONS USED:  NONE  SPECIMEN:  No Specimen  DISPOSITION OF SPECIMEN:  N/A  COUNTS:  YES  TOURNIQUET:   Total Tourniquet Time Documented: Upper Arm (Right) - 20 minutes Total: Upper Arm (Right) - 20 minutes   IMPLANTS: [employer] short narrow DVR plate with multiple smooth pegs and screws  DICTATION: .Dragon Dictation    patient brought the operating room and after  general anesthesia was obtained, the right arm was prepped and draped in usual sterile fashion. Tourniquet was applied to the upper arm and after patient identification timeout procedures were completed tourniquet was raised with fingertrap traction being applied over the end of the bed. Volar approach was made to the distal radius centered over the FCR tendon. Tendon sheath was incised and the tendon retracted radially where the deep fascia was then incised muscles retracted to the ulnar side and pronator elevated off the proximal distal fragments along the radial border. There was some comminution but it was an extra-articular fracture. With the traction applied and use of freer elevator, length was restored.  The dorsal tilt remained so distal first technique was utilized with the plate applied pinned into position and checking that position when is acceptable all the distal peg holes were filled using standard technique, drilling,  measuring and placing the smooth pegs.  those were all tightened the plate was then brought down to the shaft and 3 cortical screws placed. Using fluoroscopic views with the traction off there was stable fixation to range of motion there is no penetration of hardware into the distal radial ulnar joint or distal radial carpal joint the wound was thoroughly irrigated.  The tourniquet was let down and there is no significant bleeding from the distal radius approach the wound was closed with 3-0 Vicryl subcutaneously followed by 4-0 nylon in simple interrupted fashion. Xeroform 4 x 4's web roll volar splint and Ace wrap applied.  PLAN OF CARE: Discharge to home after PACU  PATIENT DISPOSITION:  PACU - hemodynamically stable.

## 2020-11-05 NOTE — Anesthesia Procedure Notes (Signed)
Procedure Name: LMA Insertion Performed by: Mathews Argyle, CRNA Pre-anesthesia Checklist: Patient identified, Patient being monitored, Timeout performed, Emergency Drugs available and Suction available Patient Re-evaluated:Patient Re-evaluated prior to induction Oxygen Delivery Method: Circle system utilized Preoxygenation: Pre-oxygenation with 100% oxygen Induction Type: IV induction Ventilation: Mask ventilation without difficulty LMA: LMA inserted LMA Size: 3.0 Tube type: Oral Number of attempts: 2 (sized down from LMA 4 to 3) Placement Confirmation: positive ETCO2 and breath sounds checked- equal and bilateral Tube secured with: Tape Dental Injury: Teeth and Oropharynx as per pre-operative assessment

## 2020-11-05 NOTE — H&P (Signed)
Chief Complaint  Patient presents with  . Ankle Injury  Fell down stairs 10/23/20, swelling  . Wrist Injury  seen out of twn, told she had radius fracture, in splint   Brooke Tucker is a 69 y.o. female who presents today for evaluation of right wrist fracture. Patient suffered a right wrist fracture 8 days ago on 10/23/2020. Patient states she was out of town on vacation, fell down the steps and injured her left ankle and right wrist. She is right-hand dominant. She denies any numbness or tingling. She is taken Tylenol twice daily for pain. She is tolerating splint well. Splint extends past her fingertips and she is unable to move her digits. Patient is very active. Patient has been ambulating with no assistive eye since this accident. She has had some swelling on the lateral aspect of the ankle. Denies having x-rays of the ankle after her accident. X-rays of her wrist on 10/23/2020 at the urgent care facility showed a comminuted displaced distal radial metaphyseal fracture with impaction  Past Medical History: Past Medical History:  Diagnosis Date  . Hypothyroid, unspecified  . Neuropathy  . Osteoarthritis  . Seropositive rheumatoid arthritis ([facility]) 08/28/2014  a. Methotrexate. b. High positive anti-CCP antibodies.   Past Surgical History: Past Surgical History:  Procedure Laterality Date  . ARTHROPLASTY TOTAL KNEE Left 06/28/2018  Dr.Brytney Somes  . Arthroscopy knee, lateral release, partial lateral menisectomy, excision plica Left 01/07/2016  Dr.Ramon Zanders  . COLONOSCOPY  . Cyst removed from ovary  . Foot surgery Bilateral  Left Big Toe Resetting and Right foot repair   Past Family History: Family History  Problem Relation Age of Onset  . Osteoporosis (Thinning of bones) Mother  . Diabetes Father  . Myocardial Infarction (Heart attack) Father   Medications: Current Outpatient Medications Ordered in Epic  Medication Sig Dispense Refill  . metoprolol succinate (TOPROL-XL) 100 MG XL tablet  Take 100 mg by mouth once daily  . TRELEGY ELLIPTA 100-62.5-25 mcg inhaler INHALE 1 PUFF ONE TIME DAILY 3 each 1   No current Epic-ordered facility-administered medications on file.   Allergies: Allergies  Allergen Reactions  . Hydrocodone-Acetaminophen Unknown and Nausea And Vomiting  . Naproxen Unknown and Nausea And Vomiting  . Tramadol Nausea And Vomiting  . Oxycodone Nausea    Review of Systems:  A comprehensive 14 point ROS was performed, reviewed by me today, and the pertinent orthopaedic findings are documented in the HPI.  Exam: BP 152/76  Ht 152.4 cm (5')  Wt 86 kg (189 lb 9.6 oz)  BMI 37.03 kg/m  General:  Well developed, well nourished, no apparent distress, normal affect, normal gait with no antalgic component.   HEENT: Head normocephalic, atraumatic, PERRL.   Abdomen: Soft, non tender, non distended, Bowel sounds present.  Heart: Examination of the heart reveals regular, rate, and rhythm. There is no murmur noted on ascultation. There is a normal apical pulse.  Lungs: Lungs are clear to auscultation. There is no wheeze, rhonchi, or crackles. There is normal expansion of bilateral chest walls.   Examination of the left ankle shows ankle stable no swelling warmth or erythema. She is slightly tender along the ATFL and the lateral side of the ankle. There is no open wounds, ankle plantar flexion dorsiflexion is intact. She is neuro vas intact in left lower extremity.  Examination of the right wrist shows patient has deformity, ulnar positive. No skin breakdown noted. She is tender along the distal radial metaphysis. Has no pain throughout the carpals or  phalanges. Nontender throughout the elbow. Neurovascular tact in right upper extremity.  AP and lateral views of the right forearm are ordered interpreted by me in the office today. Impression: Patient has a transverse distal radial metaphyseal fracture with shortening and significant impaction. There is severe  dorsal angulation with minimal comminution. No evidence of ulnar fracture. Carpals appear intact with no fracture  AP lateral and oblique views of the left ankle are ordered interpreted by me in the office today. Impression: Patient has no evidence of acute bony abnormality. Ankle mortise is intact. No significant soft tissue swelling. No abnormal bony lesions. No significant arthritic changes.  Impression: Other closed extra-articular fracture of distal end of right radius, initial encounter [S52.551A] Other closed extra-articular fracture of distal end of right radius, initial encounter (primary encounter diagnosis) Sprain of anterior talofibular ligament of left ankle, initial encounter  Plan:  2. 69 year old female with displaced right distal radial metaphyseal fracture with impaction and dorsal angulation. No carpal tunnel symptoms. She is placed into a volar splint today in the office with Ace wrap. She will continue with Tylenol for pain. Risks, benefits, complications of a right distal radius ORIF have been discussed with the patient. Patient has agreed and consented to procedure with Dr. Kennedy Bucker on 11/06/2020.  This note was generated in part with voice recognition software and I apologize for any typographical errors that were not detected and corrected.  Patience Musca MPA-C    Electronically signed by Patience Musca, PA at 10/31/2020 4:34 PM EDT  Reviewed H+P No changes noted.

## 2020-11-05 NOTE — Discharge Instructions (Addendum)
Keep arm elevated is much as you can. Work on finger motion is much as you can. Pain medicine and nausea medicine as directed. Keep splint clean and dry until return visit.  AMBULATORY SURGERY  DISCHARGE INSTRUCTIONS   1) The drugs that you were given will stay in your system until tomorrow so for the next 24 hours you should not:  A) Drive an automobile B) Make any legal decisions C) Drink any alcoholic beverage   2) You may resume regular meals tomorrow.  Today it is better to start with liquids and gradually work up to solid foods.  You may eat anything you prefer, but it is better to start with liquids, then soup and crackers, and gradually work up to solid foods.   3) Please notify your doctor immediately if you have any unusual bleeding, trouble breathing, redness and pain at the surgery site, drainage, fever, or pain not relieved by medication.    4) Additional Instructions:        Please contact your physician with any problems or Same Day Surgery at [phone], Monday through Friday 6 am to 4 pm, or [place] at [facility] number at [phone].

## 2020-11-05 NOTE — Anesthesia Preprocedure Evaluation (Signed)
Anesthesia Evaluation  Patient identified by MRN, date of birth, ID band Patient awake    Reviewed: Allergy & Precautions, NPO status , Patient's Chart, lab work & pertinent test results  History of Anesthesia Complications Negative for: history of anesthetic complications  Airway Mallampati: II  TM Distance: >3 FB Neck ROM: Full    Dental  (+) Teeth Intact, Chipped,    Pulmonary asthma , neg sleep apnea, neg COPD, Patient abstained from smoking.Not current smoker,    Pulmonary exam normal breath sounds clear to auscultation       Cardiovascular Exercise Tolerance: Good METShypertension, Pt. on medications (-) angina(-) CAD, (-) Past MI, (-) Cardiac Stents and (-) CABG + dysrhythmias (-) Valvular Problems/Murmurs Rhythm:Regular Rate:Normal - Systolic murmurs    Neuro/Psych negative neurological ROS  negative psych ROS   GI/Hepatic negative GI ROS, Neg liver ROS, neg GERD  ,  Endo/Other  neg diabetesHyperthyroidism   Renal/GU negative Renal ROS     Musculoskeletal  (+) Arthritis , Rheumatoid disorders,    Abdominal   Peds  Hematology negative hematology ROS (+)   Anesthesia Other Findings Past Medical History: No date: Arthritis No date: Dysrhythmia     Comment:  tachycardia  No date: Hypertension No date: Hyperthyroidism No date: Multinodular goiter July 2008: Ovarian cyst     Comment:  laparscopic biopsy normal No date: Rheumatoid arthritis(714.0)     Comment:  managed by Beverley Fiedler with MTX   Reproductive/Obstetrics                            Anesthesia Physical  Anesthesia Plan  ASA: II  Anesthesia Plan: General   Post-op Pain Management:    Induction: Intravenous  PONV Risk Score and Plan: 3 and Ondansetron and Dexamethasone  Airway Management Planned: LMA  Additional Equipment: None  Intra-op Plan:   Post-operative Plan: Extubation in OR  Informed Consent: I  have reviewed the patients History and Physical, chart, labs and discussed the procedure including the risks, benefits and alternatives for the proposed anesthesia with the patient or authorized representative who has indicated his/her understanding and acceptance.     Dental advisory given  Plan Discussed with: Anesthesiologist and CRNA  Anesthesia Plan Comments: (Discussed risks of anesthesia with patient, including PONV, sore throat, lip/dental damage. Rare risks discussed as well, such as cardiorespiratory and neurological sequelae. Patient understands.   Offered her a nerve block given her intolerances to opioids but patient prefers to have function of her arm post operatively.)       Anesthesia Quick Evaluation

## 2020-11-06 ENCOUNTER — Encounter: Payer: Self-pay | Admitting: Orthopedic Surgery

## 2020-11-06 NOTE — Anesthesia Postprocedure Evaluation (Signed)
Anesthesia Post Note  Patient: DIANIA CO  Procedure(s) Performed: OPEN REDUCTION INTERNAL FIXATION (ORIF) DISTAL RADIAL FRACTURE (Right )  Patient location during evaluation: PACU Anesthesia Type: General Level of consciousness: awake and alert Pain management: pain level controlled Vital Signs Assessment: post-procedure vital signs reviewed and stable Respiratory status: spontaneous breathing, nonlabored ventilation, respiratory function stable and patient connected to nasal cannula oxygen Cardiovascular status: blood pressure returned to baseline and stable Postop Assessment: no apparent nausea or vomiting Anesthetic complications: no   No complications documented.   Last Vitals:  Vitals:   11/05/20 1545 11/05/20 1553  BP: 111/68 133/75  Pulse:  70  Resp:  18  Temp:  (!) 36.1 C  SpO2:  95%    Last Pain:  Vitals:   11/05/20 1553  TempSrc: Temporal  PainSc: 4                  Corinda Gubler

## 2021-08-08 ENCOUNTER — Ambulatory Visit (INDEPENDENT_AMBULATORY_CARE_PROVIDER_SITE_OTHER): Payer: Medicare Other

## 2021-08-08 VITALS — Ht 60.0 in | Wt 172.0 lb

## 2021-08-08 DIAGNOSIS — Z0000 Encounter for general adult medical examination without abnormal findings: Secondary | ICD-10-CM

## 2021-08-08 DIAGNOSIS — Z1231 Encounter for screening mammogram for malignant neoplasm of breast: Secondary | ICD-10-CM

## 2021-08-08 NOTE — Progress Notes (Addendum)
Subjective:   Brooke Tucker is a 70 y.o. female who presents for Medicare Annual (Subsequent) preventive examination.  Review of Systems    No ROS.  Medicare Wellness Virtual Visit.  Visual/audio telehealth visit, UTA vital signs.   See social history for additional risk factors.   Cardiac Risk Factors include: advanced age (>79men, >31 women);hypertension     Objective:    Today's Vitals   08/08/21 0903  Weight: 172 lb (78 kg)  Height: 5' (1.524 m)   Body mass index is 33.59 kg/m.  Advanced Directives 08/08/2021 11/05/2020 11/04/2020 08/07/2020 08/07/2019 06/28/2018 06/14/2018  Does Patient Have a Medical Advance Directive? No No No No Yes No No  Type of Advance Directive - - - - Midwife;Living will - -  Does patient want to make changes to medical advance directive? No - Patient declined - - - No - Patient declined - -  Copy of Healthcare Power of Attorney in Chart? - - - - No - copy requested - -  Would patient like information on creating a medical advance directive? - No - Patient declined No - Patient declined Yes (MAU/Ambulatory/Procedural Areas - Information given) - Yes (Inpatient - patient requests chaplain consult to create a medical advance directive) Yes (MAU/Ambulatory/Procedural Areas - Information given)    Current Medications (verified) Outpatient Encounter Medications as of 08/08/2021  Medication Sig   lisinopril (ZESTRIL) 10 MG tablet Take 1 tablet by mouth daily.   diphenhydramine-acetaminophen (TYLENOL PM) 25-500 MG TABS tablet Take 1 tablet by mouth at bedtime.   metoprolol succinate (TOPROL-XL) 100 MG 24 hr tablet Take 100 mg by mouth daily. Take with or immediately following a meal.   TRELEGY ELLIPTA 100-62.5-25 MCG/INH AEPB Inhale 1 puff into the lungs every other day.    [DISCONTINUED] acetaminophen (TYLENOL) 500 MG tablet Take 1,000 mg by mouth every 6 (six) hours as needed for moderate pain.   [DISCONTINUED] HYDROcodone-acetaminophen  (NORCO) 5-325 MG tablet Take 1 tablet by mouth every 6 (six) hours as needed for moderate pain.   [DISCONTINUED] ondansetron (ZOFRAN ODT) 4 MG disintegrating tablet Take 1 tablet (4 mg total) by mouth every 8 (eight) hours as needed for nausea or vomiting.   No facility-administered encounter medications on file as of 08/08/2021.    Allergies (verified) Naproxen, Tramadol, Vicodin [hydrocodone-acetaminophen], and Oxycodone   History: Past Medical History:  Diagnosis Date   Arthritis    Asthma    Cardiomyopathy (HCC)    Dysrhythmia    tachycardia    Hypertension    Hyperthyroidism    Mild aortic valve stenosis    Multinodular goiter    Ovarian cyst July 2008   laparscopic biopsy normal   Rheumatoid arthritis(714.0)    managed by Beverley Fiedler with MTX   Past Surgical History:  Procedure Laterality Date   BREAST BIOPSY Left 2012   fibroadenoma   BREAST BIOPSY Left 10/12/2019   Affirm Bx- Ribbon clip- fibroadenoma   BREAST BIOPSY Right 10/12/2019   Affirm Bx- X-clip- fibroadenoma   CARDIAC CATHETERIZATION     COMBINED HYSTEROSCOPY DIAGNOSTIC / D&C  July 2008   [facility]   ESOPHAGOGASTRODUODENOSCOPY  July 2008   normal   EXCISION MORTON'S NEUROMA     Left foot   JOINT REPLACEMENT Left    KNEE ARTHROSCOPY Left 01/07/2016   Procedure: ARTHROSCOPY KNEE, lateral release, partial lateral menisectomy, excision plica;  Surgeon: Kennedy Bucker, MD;  Location: [facility];  Service: Orthopedics;  Laterality: Left;  LEFT HEART CATH AND CORONARY ANGIOGRAPHY Left 02/03/2018   Procedure: LEFT HEART CATH AND CORONARY ANGIOGRAPHY;  Surgeon: Marcina Millard, MD;  Location: [facility];  Service: Cardiovascular;  Laterality: Left;   METATARSAL OSTEOTOMY  dec 2012   right foot, 5th MT  ([place])   OPEN REDUCTION INTERNAL FIXATION (ORIF) DISTAL RADIAL FRACTURE Right 11/05/2020   Procedure: OPEN REDUCTION INTERNAL FIXATION (ORIF) DISTAL RADIAL FRACTURE;  Surgeon: Kennedy Bucker, MD;  Location:  [facility];  Service: Orthopedics;  Laterality: Right;   TOTAL KNEE ARTHROPLASTY Left 06/28/2018   Procedure: TOTAL KNEE ARTHROPLASTY;  Surgeon: Kennedy Bucker, MD;  Location: [facility];  Service: Orthopedics;  Laterality: Left;   Family History  Problem Relation Age of Onset   Diabetes Father    Heart disease Father    Cancer Maternal Aunt        Breast, metastatic   Breast cancer Maternal Aunt 60   Cancer Maternal Grandmother        ovarian   Dementia Mother    Thyroid disease Neg Hx    Social History   Socioeconomic History   Marital status: Married    Spouse name: Not on file   Number of children: Not on file   Years of education: Not on file   Highest education level: Not on file  Occupational History   Not on file  Tobacco Use   Smoking status: Never   Smokeless tobacco: Never  Vaping Use   Vaping Use: Never used  Substance and Sexual Activity   Alcohol use: Yes    Comment: occasional   Drug use: No   Sexual activity: Not Currently  Other Topics Concern   Not on file  Social History Narrative   Not on file   Social Determinants of Health   Financial Resource Strain: Low Risk    Difficulty of Paying Living Expenses: Not hard at all  Food Insecurity: No Food Insecurity   Worried About Programme researcher, broadcasting/film/video in the Last Year: Never true   Ran Out of Food in the Last Year: Never true  Transportation Needs: No Transportation Needs   Lack of Transportation (Medical): No   Lack of Transportation (Non-Medical): No  Physical Activity: Sufficiently Active   Days of Exercise per Week: 5 days   Minutes of Exercise per Session: 50 min  Stress: No Stress Concern Present   Feeling of Stress : Not at all  Social Connections: Socially Integrated   Frequency of Communication with Friends and Family: More than three times a week   Frequency of Social Gatherings with Friends and Family: Not on file   Attends Religious Services: More than 4 times per year   Active Member of [employer]  or Organizations: Yes   Attends Engineer, structural: More than 4 times per year   Marital Status: Married    Tobacco Counseling Counseling given: Not Answered   Clinical Intake:  Pre-visit preparation completed: Yes        Diabetes: No  How often do you need to have someone help you when you read instructions, pamphlets, or other written materials from your doctor or pharmacy?: 1 - Never    Interpreter Needed?: No      Activities of Daily Living In your present state of health, do you have any difficulty performing the following activities: 08/08/2021 11/04/2020  Hearing? N -  Vision? N -  Difficulty concentrating or making decisions? N -  Walking or climbing stairs? Brooke Tucker  Comment SOBOE. Followed by Cardiology. -  Dressing or bathing? N -  Doing errands, shopping? N N  Preparing Food and eating ? N -  Using the Toilet? N -  In the past six months, have you accidently leaked urine? N -  Do you have problems with loss of bowel control? N -  Managing your Medications? N -  Managing your Finances? N -  Housekeeping or managing your Housekeeping? N -  Some recent data might be hidden    Patient Care Team: Sherlene Shams, MD as PCP - General (Internal Medicine) Sherlene Shams, MD (Internal Medicine)  Indicate any recent Medical Services you may have received from other than [facility] providers in the past year (date may be approximate).     Assessment:   This is a routine wellness examination for Brooke Tucker.  I connected with Gina today by telephone and verified that I am speaking with the correct person using two identifiers. Location patient: home Location provider: work Persons participating in the virtual visit: patient, Engineer, civil (consulting).    I discussed the limitations, risks, security and privacy concerns of performing an evaluation and management service by telephone and the availability of in person appointments. The patient expressed understanding and verbally  consented to this telephonic visit.    Interactive audio and video telecommunications were attempted between this provider and patient, however failed, due to patient having technical difficulties OR patient did not have access to video capability.  We continued and completed visit with audio only.  Some vital signs may be absent or patient reported.   Hearing/Vision screen Hearing Screening - Comments:: Patient is able to hear conversational tones without difficulty.  No issues reported. Vision Screening - Comments:: Wears corrective lenses  They have seen their ophthalmologist in the last 12 months.   Dietary issues and exercise activities discussed: Current Exercise Habits: Home exercise routine, Type of exercise: calisthenics (Water aerobic), Time (Minutes): 25, Frequency (Times/Week): 4, Weekly Exercise (Minutes/Week): 100, Intensity: Mild Healthy diet Good water intake   Goals Addressed               This Visit's Progress     Patient Stated     Increase physical activity (pt-stated)   On track     I want to increase my daily steps Swim more for exercise       Weight (lb) < 169 lb (76.7 kg) (pt-stated)   172 lb (78 kg)     Lose 10-15lb       Depression Screen PHQ 2/9 Scores 08/08/2021 08/07/2020 08/07/2019 02/11/2018 03/19/2015 10/20/2012  PHQ - 2 Score 0 0 0 0 0 0  PHQ- 9 Score - - - 3 - -    Fall Risk Fall Risk  08/08/2021 10/14/2020 08/07/2020 10/09/2019 08/07/2019  Falls in the past year? 0 0 0 0 0  Number falls in past yr: - - 0 - -  Follow up Falls evaluation completed Falls evaluation completed Falls evaluation completed Falls evaluation completed -    FALL RISK PREVENTION PERTAINING TO THE HOME: Adequate lighting in your home to reduce risk of falls? Yes   ASSISTIVE DEVICES UTILIZED TO PREVENT FALLS: Life alert? No  Use of a cane, walker or w/c? No   TIMED UP AND GO: Was the test performed? No .   Cognitive Function:  Patient is alert and oriented x3.     6CIT Screen 08/07/2019  What Year? 0 points  What month? 0 points  What time? 0  points  Count back from 20 0 points  Months in reverse 0 points    Immunizations Immunization History  Administered Date(s) Administered   Fluad Quad(high Dose 65+) 09/26/2019   Influenza, High Dose Seasonal PF 10/14/2018   Influenza-Unspecified 09/14/2013, 09/18/2014, 10/16/2015, 10/21/2017   Moderna Sars-Covid-2 Vaccination 01/23/2020, 02/20/2020   Pneumococcal Conjugate-13 03/19/2015   Pneumococcal Polysaccharide-23 10/23/2010, 02/11/2018   Tdap 10/20/2012   Shingrix vaccine- Due, Education has been provided regarding the importance of this vaccine. Advised may receive this vaccine at local pharmacy or Health Dept. Aware to provide a copy of the vaccination record if obtained from local pharmacy or Health Dept. Verbalized acceptance and understanding. Deferred.   Health Maintenance Health Maintenance  Topic Date Due   Fecal DNA (Cologuard)  Never done   COVID-19 Vaccine (3 - Moderna risk series) 08/24/2021 (Originally 03/19/2020)   INFLUENZA VACCINE  09/24/2021 (Originally 07/28/2021)   Zoster Vaccines- Shingrix (1 of 2) 11/08/2021 (Originally 08/29/1970)   MAMMOGRAM  09/28/2021   TETANUS/TDAP  10/20/2022   DEXA SCAN  Completed   Hepatitis C Screening  Completed   PNA vac Low Risk Adult  Completed   HPV VACCINES  Aged Out   Cologuard- deferred per patient preference.   Mammogram status: Completed 08/26/20. Repeat every year. Ordered today.   Lung Cancer Screening: (Low Dose CT Chest recommended if Age 74-80 years, 30 pack-year currently smoking OR have quit w/in 15years.) does not qualify.   Vision Screening: Recommended annual ophthalmology exams for early detection of glaucoma and other disorders of the eye.  Dental Screening: Recommended annual dental exams for proper oral hygiene.  Community Resource Referral / Chronic Care Management: CRR required this visit?  No   CCM required this  visit?  No      Plan:   Keep all routine maintenance appointments.   I have personally reviewed and noted the following in the patient's chart:   Medical and social history Use of alcohol, tobacco or illicit drugs  Current medications and supplements including opioid prescriptions. Patient is not currently taking opioid.  Functional ability and status Nutritional status Physical activity Advanced directives List of other physicians Hospitalizations, surgeries, and ER visits in previous 12 months Vitals Screenings to include cognitive, depression, and falls Referrals and appointments  In addition, I have reviewed and discussed with patient certain preventive protocols, quality metrics, and best practice recommendations. A written personalized care plan for preventive services as well as general preventive health recommendations were provided to patient.     OBrien-Blaney, Verda Mehta L, LPN   06/29/5008     I have reviewed the above information and agree with above.   Duncan Dull, MD

## 2021-08-08 NOTE — Patient Instructions (Signed)
  [name] , Thank you for taking time to come for your Medicare Wellness Visit. I appreciate your ongoing commitment to your health goals. Please review the following plan we discussed and let me know if I can assist you in the future.   These are the goals we discussed:  Goals       Patient Stated     Increase physical activity (pt-stated)      I want to increase my daily steps Swim more for exercise       Weight (lb) < 169 lb (76.7 kg) (pt-stated)      Lose 10-15lb        This is a list of the screening recommended for you and due dates:  Health Maintenance  Topic Date Due   Cologuard (Stool DNA test)  Never done   COVID-19 Vaccine (3 - Moderna risk series) 08/24/2021*   Flu Shot  09/24/2021*   Zoster (Shingles) Vaccine (1 of 2) 11/08/2021*   Mammogram  09/28/2021   Tetanus Vaccine  10/20/2022   DEXA scan (bone density measurement)  Completed   Hepatitis C Screening: USPSTF Recommendation to screen - Ages 24-79 yo.  Completed   Pneumonia vaccines  Completed   HPV Vaccine  Aged Out  *Topic was postponed. The date shown is not the original due date.

## 2021-09-16 ENCOUNTER — Telehealth: Payer: Self-pay | Admitting: *Deleted

## 2021-09-16 DIAGNOSIS — E559 Vitamin D deficiency, unspecified: Secondary | ICD-10-CM

## 2021-09-16 DIAGNOSIS — I10 Essential (primary) hypertension: Secondary | ICD-10-CM

## 2021-09-16 DIAGNOSIS — E669 Obesity, unspecified: Secondary | ICD-10-CM

## 2021-09-16 NOTE — Telephone Encounter (Signed)
Please place future orders for lab appt.  

## 2021-09-17 ENCOUNTER — Other Ambulatory Visit: Payer: Self-pay | Admitting: Internal Medicine

## 2021-09-17 DIAGNOSIS — R921 Mammographic calcification found on diagnostic imaging of breast: Secondary | ICD-10-CM

## 2021-09-18 ENCOUNTER — Other Ambulatory Visit (INDEPENDENT_AMBULATORY_CARE_PROVIDER_SITE_OTHER): Payer: Medicare Other

## 2021-09-18 ENCOUNTER — Other Ambulatory Visit: Payer: Self-pay

## 2021-09-18 DIAGNOSIS — I10 Essential (primary) hypertension: Secondary | ICD-10-CM | POA: Diagnosis not present

## 2021-09-18 LAB — LIPID PANEL
Cholesterol: 183 mg/dL (ref 0–200)
HDL: 79.8 mg/dL (ref >39.00)
LDL Cholesterol: 86 mg/dL (ref 0–99)
NonHDL: 103.06
Total CHOL/HDL Ratio: 2
Triglycerides: 83 mg/dL (ref 0.0–149.0)
VLDL: 16.6 mg/dL (ref 0.0–40.0)

## 2021-09-18 LAB — COMPREHENSIVE METABOLIC PANEL
ALT: 7 U/L (ref 0–35)
AST: 12 U/L (ref 0–37)
Albumin: 4.1 g/dL (ref 3.5–5.2)
Alkaline Phosphatase: 71 U/L (ref 39–117)
BUN: 10 mg/dL (ref 6–23)
CO2: 28 mEq/L (ref 19–32)
Calcium: 9.1 mg/dL (ref 8.4–10.5)
Chloride: 103 mEq/L (ref 96–112)
Creatinine, Ser: 0.69 mg/dL (ref 0.40–1.20)
GFR: 88.16 mL/min (ref >60.00)
Glucose, Bld: 85 mg/dL (ref 70–99)
Potassium: 4.2 mEq/L (ref 3.5–5.1)
Sodium: 139 mEq/L (ref 135–145)
Total Bilirubin: 0.5 mg/dL (ref 0.2–1.2)
Total Protein: 7 g/dL (ref 6.0–8.3)

## 2021-09-22 ENCOUNTER — Encounter: Payer: Self-pay | Admitting: Internal Medicine

## 2021-09-22 ENCOUNTER — Ambulatory Visit (INDEPENDENT_AMBULATORY_CARE_PROVIDER_SITE_OTHER): Payer: Medicare Other | Admitting: Internal Medicine

## 2021-09-22 ENCOUNTER — Other Ambulatory Visit: Payer: Self-pay

## 2021-09-22 VITALS — BP 124/72 | HR 85 | Temp 95.8°F | Ht 60.0 in | Wt 171.6 lb

## 2021-09-22 DIAGNOSIS — M810 Age-related osteoporosis without current pathological fracture: Secondary | ICD-10-CM

## 2021-09-22 DIAGNOSIS — S52501S Unspecified fracture of the lower end of right radius, sequela: Secondary | ICD-10-CM

## 2021-09-22 DIAGNOSIS — E042 Nontoxic multinodular goiter: Secondary | ICD-10-CM

## 2021-09-22 DIAGNOSIS — Z780 Asymptomatic menopausal state: Secondary | ICD-10-CM

## 2021-09-22 DIAGNOSIS — E559 Vitamin D deficiency, unspecified: Secondary | ICD-10-CM | POA: Diagnosis not present

## 2021-09-22 DIAGNOSIS — I422 Other hypertrophic cardiomyopathy: Secondary | ICD-10-CM | POA: Diagnosis not present

## 2021-09-22 DIAGNOSIS — E669 Obesity, unspecified: Secondary | ICD-10-CM

## 2021-09-22 DIAGNOSIS — E0590 Thyrotoxicosis, unspecified without thyrotoxic crisis or storm: Secondary | ICD-10-CM | POA: Diagnosis not present

## 2021-09-22 MED ORDER — ONDANSETRON 4 MG PO TBDP: 4.0000 mg | ORAL_TABLET | Freq: Three times a day (TID) | ORAL | 5 refills | Status: DC | PRN

## 2021-09-22 MED ORDER — ZOSTER VAC RECOMB ADJUVANTED 50 MCG/0.5ML IM SUSR: 0.5000 mL | Freq: Once | INTRAMUSCULAR | 1 refills | Status: AC

## 2021-09-22 NOTE — Progress Notes (Signed)
 Subjective:  Patient ID: Brooke Tucker, female    DOB: 11/20/1951  Age: 70 y.o. MRN: [id number]  CC: The primary encounter diagnosis was Postmenopausal estrogen deficiency. Diagnoses of Vitamin D deficiency, Hyperthyroidism, Hypertrophic nonobstructive cardiomyopathy (HCC), Multinodular goiter, Closed fracture of distal end of right radius, unspecified fracture morphology, sequela, Obesity (BMI 30-39.9), and Age-related osteoporosis without current pathological fracture were also pertinent to this visit.  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  She has RA managed by  Dr Kernodle.     Weight loss of 16 lbs from Nov to August  using Herbal Life . Replacing 2 meals daily with shakes  caused diarrhea,  now just once daily with fruit added for breakfast.   Swimming all summer. Treading water in the deep water for 30 minutes   Resumed MTX , 6 tablets once weekly.  ESR has normalized  and joints  feeling better .  But feeing nauseated . LFTS ave been checked every 3 months and normal.  Wants to avoid the infusion Outpatient Medications Prior to Visit  Medication Sig Dispense Refill   diphenhydramine-acetaminophen (TYLENOL PM) 25-500 MG TABS tablet Take 1 tablet by mouth at bedtime.     lisinopril (ZESTRIL) 10 MG tablet Take 1 tablet by mouth daily.     metoprolol succinate (TOPROL-XL) 100 MG 24 hr tablet Take 100 mg by mouth daily. Take with or immediately following a meal.     TRELEGY ELLIPTA 100-62.5-25 MCG/INH AEPB Inhale 1 puff into the lungs as needed.     No facility-administered medications prior to visit.    Review of Systems;  Patient denies headache, fevers, malaise, unintentional weight loss, skin rash, eye pain, sinus congestion and sinus pain, sore throat, dysphagia,  hemoptysis ,  cough, dyspnea, wheezing, chest pain, palpitations, orthopnea, edema, abdominal pain, nausea, melena, diarrhea, constipation, flank pain, dysuria, hematuria, urinary  Frequency, nocturia, numbness, tingling, seizures,  Focal weakness, Loss of consciousness,  Tremor, insomnia, depression, anxiety, and suicidal ideation.      Objective:  BP 124/72   Pulse 85   Temp (!) 95.8 F (35.4 C)   Ht 5' (1.524 m)   Wt 171 lb 9.6 oz (77.8 kg)   SpO2 97%   BMI 33.51 kg/m   BP Readings from Last 3 Encounters:  09/22/21 124/72  11/05/20 133/75  11/04/20 (!) 159/81    Wt Readings from Last 3 Encounters:  09/22/21 171 lb 9.6 oz (77.8 kg)  08/08/21 172 lb (78 kg)  11/05/20 188 lb (85.3 kg)    General appearance: alert, cooperative and appears stated age Ears: normal TM's and external ear canals both ears Throat: lips, mucosa, and tongue normal; teeth and gums normal Neck: no adenopathy, no carotid bruit, supple, symmetrical, trachea midline and thyroid not enlarged, symmetric, no tenderness/mass/nodules Back: symmetric, no curvature. ROM normal. No CVA tenderness. Lungs: clear to auscultation bilaterally Heart: regular rate and rhythm, S1, S2 normal, no murmur, click, rub or gallop Abdomen: soft, non-tender; bowel sounds normal; no masses,  no organomegaly Pulses: 2+ and symmetric Skin: Skin color, texture, turgor normal. No rashes or lesions Lymph nodes: Cervical, supraclavicular, and axillary nodes normal.  Lab Results  Component Value Date   HGBA1C 5.7 09/26/2019    Lab Results  Component Value Date   CREATININE 0.69    Subjective:  Patient ID: Brooke Tucker, female    DOB: 11/20/1951  Age: 70 y.o. MRN: [id number]  CC: The primary encounter diagnosis was Postmenopausal estrogen deficiency. Diagnoses of Vitamin D deficiency, Hyperthyroidism, Hypertrophic nonobstructive cardiomyopathy (HCC), Multinodular goiter, Closed fracture of distal end of right radius, unspecified fracture morphology, sequela, Obesity (BMI 30-39.9), and Age-related osteoporosis without current pathological fracture were also pertinent to this visit.  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  She has RA managed by  Dr Kernodle.     Weight loss of 16 lbs from Nov to August  using Herbal Life . Replacing 2 meals daily with shakes  caused diarrhea,  now just once daily with fruit added for breakfast.   Swimming all summer. Treading water in the deep water for 30 minutes   Resumed MTX , 6 tablets once weekly.  ESR has normalized  and joints  feeling better .  But feeing nauseated . LFTS ave been checked every 3 months and normal.  Wants to avoid the infusion Outpatient Medications Prior to Visit  Medication Sig Dispense Refill   diphenhydramine-acetaminophen (TYLENOL PM) 25-500 MG TABS tablet Take 1 tablet by mouth at bedtime.     lisinopril (ZESTRIL) 10 MG tablet Take 1 tablet by mouth daily.     metoprolol succinate (TOPROL-XL) 100 MG 24 hr tablet Take 100 mg by mouth daily. Take with or immediately following a meal.     TRELEGY ELLIPTA 100-62.5-25 MCG/INH AEPB Inhale 1 puff into the lungs as needed.     No facility-administered medications prior to visit.    Review of Systems;  Patient denies headache, fevers, malaise, unintentional weight loss, skin rash, eye pain, sinus congestion and sinus pain, sore throat, dysphagia,  hemoptysis ,  cough, dyspnea, wheezing, chest pain, palpitations, orthopnea, edema, abdominal pain, nausea, melena, diarrhea, constipation, flank pain, dysuria, hematuria, urinary  Frequency, nocturia, numbness, tingling, seizures,  Focal weakness, Loss of consciousness,  Tremor, insomnia, depression, anxiety, and suicidal ideation.      Objective:  BP 124/72   Pulse 85   Temp (!) 95.8 F (35.4 C)   Ht 5' (1.524 m)   Wt 171 lb 9.6 oz (77.8 kg)   SpO2 97%   BMI 33.51 kg/m   BP Readings from Last 3 Encounters:  09/22/21 124/72  11/05/20 133/75  11/04/20 (!) 159/81    Wt Readings from Last 3 Encounters:  09/22/21 171 lb 9.6 oz (77.8 kg)  08/08/21 172 lb (78 kg)  11/05/20 188 lb (85.3 kg)    General appearance: alert, cooperative and appears stated age Ears: normal TM's and external ear canals both ears Throat: lips, mucosa, and tongue normal; teeth and gums normal Neck: no adenopathy, no carotid bruit, supple, symmetrical, trachea midline and thyroid not enlarged, symmetric, no tenderness/mass/nodules Back: symmetric, no curvature. ROM normal. No CVA tenderness. Lungs: clear to auscultation bilaterally Heart: regular rate and rhythm, S1, S2 normal, no murmur, click, rub or gallop Abdomen: soft, non-tender; bowel sounds normal; no masses,  no organomegaly Pulses: 2+ and symmetric Skin: Skin color, texture, turgor normal. No rashes or lesions Lymph nodes: Cervical, supraclavicular, and axillary nodes normal.  Lab Results  Component Value Date   HGBA1C 5.7 09/26/2019    Lab Results  Component Value Date   CREATININE 0.69    Subjective:  Patient ID: Brooke Tucker, female    DOB: 11/20/1951  Age: 70 y.o. MRN: [id number]  CC: The primary encounter diagnosis was Postmenopausal estrogen deficiency. Diagnoses of Vitamin D deficiency, Hyperthyroidism, Hypertrophic nonobstructive cardiomyopathy (HCC), Multinodular goiter, Closed fracture of distal end of right radius, unspecified fracture morphology, sequela, Obesity (BMI 30-39.9), and Age-related osteoporosis without current pathological fracture were also pertinent to this visit.  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  She has RA managed by  Dr Kernodle.     Weight loss of 16 lbs from Nov to August  using Herbal Life . Replacing 2 meals daily with shakes  caused diarrhea,  now just once daily with fruit added for breakfast.   Swimming all summer. Treading water in the deep water for 30 minutes   Resumed MTX , 6 tablets once weekly.  ESR has normalized  and joints  feeling better .  But feeing nauseated . LFTS ave been checked every 3 months and normal.  Wants to avoid the infusion Outpatient Medications Prior to Visit  Medication Sig Dispense Refill   diphenhydramine-acetaminophen (TYLENOL PM) 25-500 MG TABS tablet Take 1 tablet by mouth at bedtime.     lisinopril (ZESTRIL) 10 MG tablet Take 1 tablet by mouth daily.     metoprolol succinate (TOPROL-XL) 100 MG 24 hr tablet Take 100 mg by mouth daily. Take with or immediately following a meal.     TRELEGY ELLIPTA 100-62.5-25 MCG/INH AEPB Inhale 1 puff into the lungs as needed.     No facility-administered medications prior to visit.    Review of Systems;  Patient denies headache, fevers, malaise, unintentional weight loss, skin rash, eye pain, sinus congestion and sinus pain, sore throat, dysphagia,  hemoptysis ,  cough, dyspnea, wheezing, chest pain, palpitations, orthopnea, edema, abdominal pain, nausea, melena, diarrhea, constipation, flank pain, dysuria, hematuria, urinary  Frequency, nocturia, numbness, tingling, seizures,  Focal weakness, Loss of consciousness,  Tremor, insomnia, depression, anxiety, and suicidal ideation.      Objective:  BP 124/72   Pulse 85   Temp (!) 95.8 F (35.4 C)   Ht 5' (1.524 m)   Wt 171 lb 9.6 oz (77.8 kg)   SpO2 97%   BMI 33.51 kg/m   BP Readings from Last 3 Encounters:  09/22/21 124/72  11/05/20 133/75  11/04/20 (!) 159/81    Wt Readings from Last 3 Encounters:  09/22/21 171 lb 9.6 oz (77.8 kg)  08/08/21 172 lb (78 kg)  11/05/20 188 lb (85.3 kg)    General appearance: alert, cooperative and appears stated age Ears: normal TM's and external ear canals both ears Throat: lips, mucosa, and tongue normal; teeth and gums normal Neck: no adenopathy, no carotid bruit, supple, symmetrical, trachea midline and thyroid not enlarged, symmetric, no tenderness/mass/nodules Back: symmetric, no curvature. ROM normal. No CVA tenderness. Lungs: clear to auscultation bilaterally Heart: regular rate and rhythm, S1, S2 normal, no murmur, click, rub or gallop Abdomen: soft, non-tender; bowel sounds normal; no masses,  no organomegaly Pulses: 2+ and symmetric Skin: Skin color, texture, turgor normal. No rashes or lesions Lymph nodes: Cervical, supraclavicular, and axillary nodes normal.  Lab Results  Component Value Date   HGBA1C 5.7 09/26/2019    Lab Results  Component Value Date   CREATININE 0.69

## 2021-09-22 NOTE — Patient Instructions (Addendum)
Your bone density test  been ordered.  Please call to make your appointment at [place]  [phone]   Since you are post menopausal and have osteopenia:   I recommend getting AT LEAST HALF OF  your calcium and Vitamin D  through diet rather than supplements given the recent association of calcium supplements with increased coronary artery calcium scores.  You need 1200 mg calcium daily and 2000 IUs of Vit D   Your Herbal Life may supply some. Try using Tums,  almond and cashew milks which are fortified and carried by  most grocery stores   in the dairy  Section.   They are lactose free.  Soy milk should not be used if you have a history of breast cancer.     The CDC has advised that it is SAFE to combine the flu and COVID vaccinations on the same day  I recommend, however that you get the COVID booster vaccine now,  and postpone the high dose flu vaccine until  the end of October  Shingrix should NOT be received on the same day as your  high dose flu vaccine    You have had all the Pneumonia vaccines you need.

## 2021-09-23 DIAGNOSIS — S5291XA Unspecified fracture of right forearm, initial encounter for closed fracture: Secondary | ICD-10-CM

## 2021-09-23 HISTORY — DX: Unspecified fracture of right forearm, initial encounter for closed fracture: S52.91XA

## 2021-09-23 NOTE — Assessment & Plan Note (Addendum)
With recent radial fracture requiring ORIF Oct 2021.  Last DEXA 2015.   Ordered today. Previous treatments included Boniva which patient stopped in 2014 .  Last dexa no sigificant change in  2015. Will recommend Prolia following receipt of DEXA report.

## 2021-09-23 NOTE — Assessment & Plan Note (Signed)
Periodic surveillance done by [facility] Endicrinology TSH was 28 June 2021

## 2021-09-23 NOTE — Assessment & Plan Note (Signed)
encouraraged to continue  low glycemic index lifestyle, intermittent fasting.

## 2021-09-23 NOTE — Assessment & Plan Note (Addendum)
continue follow up with [facility] every 6 months.  Last visit May 2022, no testing since 2019 (see overview) since symptoms  had not changed.  No changes to regimen .  Weight loss, DASH diet given.  Her blood pressure was noted to  E elevated  and she was advised to check BP at home

## 2021-09-23 NOTE — Assessment & Plan Note (Addendum)
Thyroid function was checked July 2022 at [place] and [facility] was 2 .  She has a history of nontoxic MNG .

## 2021-09-23 NOTE — Assessment & Plan Note (Signed)
status post ORIF right distal radius fracture. Date of surgery 11/05/2020. Continues to improve.

## 2021-10-02 ENCOUNTER — Ambulatory Visit
Admission: RE | Admit: 2021-10-02 | Discharge: 2021-10-02 | Disposition: A | Discharge status: Home / Self Care | Payer: Medicare Other | Source: Ambulatory Visit | Attending: Internal Medicine | Admitting: Internal Medicine

## 2021-10-02 ENCOUNTER — Other Ambulatory Visit: Payer: Self-pay

## 2021-10-02 DIAGNOSIS — R921 Mammographic calcification found on diagnostic imaging of breast: Secondary | ICD-10-CM | POA: Insufficient documentation

## 2021-10-06 ENCOUNTER — Ambulatory Visit
Admission: RE | Admit: 2021-10-06 | Discharge: 2021-10-06 | Disposition: A | Discharge status: Home / Self Care | Payer: Medicare Other | Source: Ambulatory Visit | Attending: Internal Medicine | Admitting: Internal Medicine

## 2021-10-06 ENCOUNTER — Other Ambulatory Visit: Payer: Self-pay

## 2021-10-06 DIAGNOSIS — Z780 Asymptomatic menopausal state: Secondary | ICD-10-CM | POA: Diagnosis not present

## 2021-11-17 ENCOUNTER — Encounter: Payer: Self-pay | Admitting: Internal Medicine

## 2021-11-17 ENCOUNTER — Ambulatory Visit (INDEPENDENT_AMBULATORY_CARE_PROVIDER_SITE_OTHER): Payer: Medicare Other | Admitting: Internal Medicine

## 2021-11-17 ENCOUNTER — Other Ambulatory Visit: Payer: Self-pay

## 2021-11-17 DIAGNOSIS — E669 Obesity, unspecified: Secondary | ICD-10-CM

## 2021-11-17 DIAGNOSIS — M810 Age-related osteoporosis without current pathological fracture: Secondary | ICD-10-CM

## 2021-11-17 MED ORDER — RALOXIFENE HCL 60 MG PO TABS: 60.0000 mg | ORAL_TABLET | Freq: Every day | ORAL | 11 refills | Status: DC

## 2021-11-17 MED ORDER — ZOSTER VAC RECOMB ADJUVANTED 50 MCG/0.5ML IM SUSR: 0.5000 mL | Freq: Once | INTRAMUSCULAR | 1 refills | Status: AC

## 2021-11-17 NOTE — Patient Instructions (Addendum)
Check BP once daily for 7 days if  if most readings are > 140/90, increase lisinopril to 20 mg daily   Increase vita d dose to 2000 ius daily until spring   Congratulations on the weight loss !  We are starting Evista to manage your osteoporosis

## 2021-11-17 NOTE — Assessment & Plan Note (Addendum)
T scores and BMD  Reviewed; significant decrease from 2015 to 2022.  Prolia advised  But she has oral surgery needs;  Will start Evista given lack of C/I .  Reviewed calcium and Vitamin D needs

## 2021-11-17 NOTE — Assessment & Plan Note (Signed)
I have congratulated her in reduction of   BMI and encouraged  Continued weight loss with goal of 10% of body weigh over the next 6 months using a low glycemic index diet and regular exercise a minimum of 5 days per week.    

## 2021-11-17 NOTE — Progress Notes (Signed)
Subjective:  Patient ID: Brooke Tucker, female    DOB: Dec 04, 1951  Age: 70 y.o. MRN: [id number]  CC: There were no encounter diagnoses.  HPI Brooke Tucker presents for follow up on multiple issues including osteoporosis and available treatments  Chief Complaint  Patient presents with   Follow-up    Discuss osteoporosis treatment    This visit occurred during the SARS-CoV-2 public health emergency.  Safety protocols were in place, including screening questions prior to the visit, additional usage of staff PPE, and extensive cleaning of exam room while observing appropriate contact time as indicated for disinfecting solutions.    SVT: She has  been tapering off of metoprolol,  last dose was Friday  50 mg . Wants it rechecked today   Patient is taking her medications as prescribed and notes no adverse effects.  Home BP readings have been done about once per week and are  generally < 130/80 .  She is avoiding added salt in her diet and walking regularly about 3 times per week for exercise  .   Taking lisinopril started by cardiologist since June.  No cough.    Osteoporosis  by DEXA done.   Has a lot of oral surgery in the future . Discussed alternatives to Prolia and Boniva .    Obesity:  losing weight intentionally.  Stopped herbal life.  Biggest meal at lunch,    Taking 1000 ius of D3   Outpatient Medications Prior to Visit  Medication Sig Dispense Refill   diphenhydramine-acetaminophen (TYLENOL PM) 25-500 MG TABS tablet Take 1 tablet by mouth at bedtime.     lisinopril (ZESTRIL) 10 MG tablet Take 1 tablet by mouth daily.     metoprolol succinate (TOPROL-XL) 100 MG 24 hr tablet Take 100 mg by mouth daily. Take with or immediately following a meal.     ondansetron (ZOFRAN ODT) 4 MG disintegrating tablet Take 1 tablet (4 mg total) by mouth every 8 (eight) hours as needed for nausea or vomiting. 20 tablet 5   TRELEGY ELLIPTA 100-62.5-25 MCG/INH AEPB Inhale 1 puff into the lungs as  needed. (Patient not taking: Reported on 11/17/2021)     No facility-administered medications prior to visit.    Review of Systems;  Patient denies headache, fevers, malaise, unintentional weight loss, skin rash, eye pain, sinus congestion and sinus pain, sore throat, dysphagia,  hemoptysis , cough, dyspnea, wheezing, chest pain, palpitations, orthopnea, edema, abdominal pain, nausea, melena, diarrhea, constipation, flank pain, dysuria, hematuria, urinary  Frequency, nocturia, numbness, tingling, seizures,  Focal weakness, Loss of consciousness,  Tremor, insomnia, depression, anxiety, and suicidal ideation.      Objective:  BP (!) 144/84 (BP Location: Left Arm, Patient Position: Sitting, Cuff Size: Normal)   Pulse 65   Temp (!) 96.2 F (35.7 C) (Temporal)   Ht 5' (1.524 m)   Wt 171 lb (77.6 kg)   SpO2 98%   BMI 33.40 kg/m   BP Readings from Last 3 Encounters:  11/17/21 (!) 144/84  09/22/21 124/72  11/05/20 133/75    Wt Readings from Last 3 Encounters:  11/17/21 171 lb (77.6 kg)  09/22/21 171 lb 9.6 oz (77.8 kg)  08/08/21 172 lb (78 kg)    General appearance: alert, cooperative and appears stated age Ears: normal TM's and external ear canals both ears Throat: lips, mucosa, and tongue normal; teeth and gums normal Neck: no adenopathy, no carotid bruit, supple, symmetrical, trachea midline and thyroid not enlarged, symmetric, no tenderness/mass/nodules Back: symmetric,  no curvature. ROM normal. No CVA tenderness. Lungs: clear to auscultation bilaterally Heart: regular rate and rhythm, S1, S2 normal, no murmur, click, rub or gallop Abdomen: soft, non-tender; bowel sounds normal; no masses,  no organomegaly Pulses: 2+ and symmetric Skin: Skin color, texture, turgor normal. No rashes or lesions Lymph nodes: Cervical, supraclavicular, and axillary nodes normal.  Lab Results  Component Value Date   HGBA1C 5.7 09/26/2019    Lab Results  Component Value Date   CREATININE  0.69 09/18/2021   CREATININE 0.66 11/04/2020   CREATININE 0.70 09/26/2019    Lab Results  Component Value Date   WBC 7.6 11/04/2020   HGB 13.8 11/04/2020   HCT 42.3 11/04/2020   PLT 234 11/04/2020   GLUCOSE 85 09/18/2021   CHOL 183 09/18/2021   TRIG 83.0 09/18/2021   HDL 79.80 09/18/2021   LDLDIRECT 101.7 10/19/2013   LDLCALC 86 09/18/2021   ALT 7 09/18/2021   AST 12 09/18/2021   NA 139 09/18/2021   K 4.2 09/18/2021   CL 103 09/18/2021   CREATININE 0.69 09/18/2021   BUN 10 09/18/2021   CO2 28 09/18/2021   TSH 2.61 09/26/2019   INR 0.89 06/14/2018   HGBA1C 5.7 09/26/2019    DG Bone Density  Result Date: 10/06/2021 EXAM: DUAL X-RAY ABSORPTIOMETRY (DXA) FOR BONE MINERAL DENSITY IMPRESSION: Your patient Brooke Tucker completed a BMD test on 10/06/2021 using the [employer] iDXA DXA System (software version: 14.10) manufactured by [employer]. The following summarizes the results of our evaluation. Technologist: MTB PATIENT BIOGRAPHICAL: Name: Brooke Tucker, Brooke Tucker Patient ID: [id number] Birth Date: October 15, 1951 Height: 60.0 in. Gender: Female Exam Date: 10/06/2021 Weight: 171.6 lbs. Indications: Advanced Age, Caucasian, High Risk Meds, Osteoporotic, Rheumatoid Arthritis Fractures: WRIST Treatments: methotrexate, Multi-Vitamin with calcium, CALCIUM VIT D DENSITOMETRY RESULTS: Site         Region     Measured Date Measured Age WHO Classification Young Adult T-score BMD         %Change vs. Previous Significant Change (*) DualFemur Neck Left 10/06/2021 70.1 Osteoporosis -2.8 0.645 g/cm2 -8.4% Yes DualFemur Neck Left 05/03/2014 62.6 Osteopenia -2.4 0.704 g/cm2 -6.4% - DualFemur Neck Left 08/28/2010 58.9 Osteopenia -2.1 0.752 g/cm2 2.6% - DualFemur Neck Left 08/27/2008 56.9 Osteopenia -2.2 0.733 g/cm2 - - DualFemur Total Mean 10/06/2021 70.1 Osteopenia -2.2 0.734 g/cm2 -7.4% Yes DualFemur Total Mean 05/03/2014 62.6 Osteopenia -1.7 0.793 g/cm2 -9.0% Yes DualFemur Total Mean 08/28/2010 58.9  Osteopenia -1.1 0.871 g/cm2 3.7% Yes DualFemur Total Mean 08/27/2008 56.9 Osteopenia -1.3 0.840 g/cm2 - - Left Forearm Radius 33% 10/06/2021 70.1 Osteoporosis -2.9 0.623 g/cm2 -17.5% Yes Left Forearm Radius 33% 05/03/2014 62.6 Osteopenia -1.4 0.755 g/cm2 -2.3% - Left Forearm Radius 33% 08/28/2010 58.9 Osteopenia -1.2 0.773 g/cm2 0.5% - Left Forearm Radius 33% 08/27/2008 56.9 Osteopenia -1.2 0.769 g/cm2 - - ASSESSMENT: The BMD measured at Forearm Radius 33% is 0.623 g/cm2 with a T-score of -2.9. This patient is considered osteoporotic according to World Health Organization [facility]) criteria. The scan quality is good. Lumbar spine was not utilized due to advanced degenerative changes. Compared with prior study, there has been significant decrease in the total hip. World Science writer [facility]) criteria for post-menopausal, Caucasian Women: Normal:                   T-score at or above -1 SD Osteopenia/low bone mass: T-score between -1 and -2.5 SD Osteoporosis:             T-score at or  below -2.5 SD RECOMMENDATIONS: 1. All patients should optimize calcium and vitamin D intake. 2. Consider FDA-approved medical therapies in postmenopausal women and men aged 33 years and older, based on the following: a. A hip or vertebral(clinical or morphometric) fracture b. T-score < -2.5 at the femoral neck or spine after appropriate evaluation to exclude secondary causes c. Low bone mass (T-score between -1.0 and -2.5 at the femoral neck or spine) and a 10-year probability of a hip fracture > 3% or a 10-year probability of a major osteoporosis-related fracture > 20% based on the [place]-adapted WHO algorithm 3. Clinician judgment and/or patient preferences may indicate treatment for people with 10-year fracture probabilities above or below these levels FOLLOW-UP: People with diagnosed cases of osteoporosis or at high risk for fracture should have regular bone mineral density tests. For patients eligible for Medicare, routine testing is  allowed once every 2 years. The testing frequency can be increased to one year for patients who have rapidly progressing disease, those who are receiving or discontinuing medical therapy to restore bone mass, or have additional risk factors. I have reviewed this report, and agree with the above findings. [facility] Electronically Signed   By: Rolm Baptise M.D.   On: 10/06/2021 14:44    Assessment & Plan:   Problem List Items Addressed This Visit   None   I am having Brooke Tucker maintain her diphenhydramine-acetaminophen, Trelegy Ellipta, metoprolol succinate, lisinopril, and ondansetron.  No orders of the defined types were placed in this encounter.   There are no discontinued medications.  Follow-up: No follow-ups on file.   Crecencio Mc, MD

## 2022-03-19 ENCOUNTER — Other Ambulatory Visit: Payer: Medicare Other

## 2022-03-23 ENCOUNTER — Ambulatory Visit: Payer: Medicare Other | Admitting: Internal Medicine

## 2022-04-07 ENCOUNTER — Other Ambulatory Visit: Payer: Medicare Other

## 2022-04-09 ENCOUNTER — Ambulatory Visit: Payer: Medicare Other | Admitting: Internal Medicine

## 2022-09-02 ENCOUNTER — Telehealth: Payer: Self-pay

## 2022-09-02 DIAGNOSIS — E785 Hyperlipidemia, unspecified: Secondary | ICD-10-CM

## 2022-09-02 DIAGNOSIS — I10 Essential (primary) hypertension: Secondary | ICD-10-CM

## 2022-09-02 DIAGNOSIS — E0590 Thyrotoxicosis, unspecified without thyrotoxic crisis or storm: Secondary | ICD-10-CM

## 2022-09-02 DIAGNOSIS — R5383 Other fatigue: Secondary | ICD-10-CM

## 2022-09-02 DIAGNOSIS — R7301 Impaired fasting glucose: Secondary | ICD-10-CM

## 2022-09-02 NOTE — Telephone Encounter (Signed)
Patient states she would like to schedule her physical with Dr. Duncan Dull and a lab visit beforehand.  I scheduled patient's annual exam on 11/11/2022 with an appointment for fasting labs on 11/06/2022.  Please see lab orders we have in the system for patient from 09/22/2021.

## 2022-09-02 NOTE — Telephone Encounter (Signed)
I have ordered CMP, A1c, CBC, TSH, Lipid panel, LDL and Urine microalbumin. Is there anything else that needs to be ordered?

## 2022-09-07 ENCOUNTER — Ambulatory Visit: Payer: Medicare Other

## 2022-09-29 ENCOUNTER — Ambulatory Visit (INDEPENDENT_AMBULATORY_CARE_PROVIDER_SITE_OTHER): Payer: Medicare Other

## 2022-09-29 VITALS — Ht 60.0 in | Wt 171.0 lb

## 2022-09-29 DIAGNOSIS — Z0000 Encounter for general adult medical examination without abnormal findings: Secondary | ICD-10-CM

## 2022-09-29 NOTE — Progress Notes (Addendum)
Subjective:   Brooke Tucker is a 71 y.o. female who presents for Medicare Annual (Subsequent) preventive examination.  Review of Systems    No ROS.  Medicare Wellness Virtual Visit.  Visual/audio telehealth visit, UTA vital signs.   See social history for additional risk factors.   Cardiac Risk Factors include: advanced age (>4men, >37 women)     Objective:    Today's Vitals   09/29/22 1417  Weight: 171 lb (77.6 kg)  Height: 5' (1.524 m)   Body mass index is 33.4 kg/m.     09/29/2022    2:09 PM 08/08/2021    9:43 AM 11/05/2020    9:14 AM 11/04/2020    7:46 AM 08/07/2020    9:22 AM 08/07/2019    9:15 AM 06/28/2018   12:00 PM  Advanced Directives  Does Patient Have a Medical Advance Directive? Yes No No No No Yes No  Type of Estate agent of [place];Living will     Healthcare Power of [place];Living will   Does patient want to make changes to medical advance directive? No - Patient declined No - Patient declined    No - Patient declined   Copy of Healthcare Power of Attorney in Chart? No - copy requested     No - copy requested   Would patient like information on creating a medical advance directive?   No - Patient declined No - Patient declined Yes (MAU/Ambulatory/Procedural Areas - Information given)  Yes (Inpatient - patient requests chaplain consult to create a medical advance directive)    Current Medications (verified) Outpatient Encounter Medications as of 09/29/2022  Medication Sig   certolizumab pegol (CIMZIA) 2 X 200 MG/ML PSKT Inject into the skin.   diphenhydramine-acetaminophen (TYLENOL PM) 25-500 MG TABS tablet Take 1 tablet by mouth at bedtime.   lisinopril (ZESTRIL) 10 MG tablet Take 1 tablet by mouth daily.   methotrexate (RHEUMATREX) 2.5 MG tablet Take 2.5 mg by mouth once a week. Takes 6 tablets weekly   metoprolol succinate (TOPROL-XL) 100 MG 24 hr tablet Take 100 mg by mouth daily. Take with or immediately following a meal.    ondansetron (ZOFRAN ODT) 4 MG disintegrating tablet Take 1 tablet (4 mg total) by mouth every 8 (eight) hours as needed for nausea or vomiting.   raloxifene (EVISTA) 60 MG tablet Take 1 tablet (60 mg total) by mouth daily.   No facility-administered encounter medications on file as of 09/29/2022.    Allergies (verified) Naproxen, Tramadol, Vicodin [hydrocodone-acetaminophen], and Oxycodone   History: Past Medical History:  Diagnosis Date   Arthritis    Asthma    Cardiomyopathy (HCC)    Dysrhythmia    tachycardia    Hypertension    Hyperthyroidism    Mild aortic valve stenosis    Multinodular goiter    Ovarian cyst July 2008   laparscopic biopsy normal   Rheumatoid arthritis(714.0)    managed by Beverley Fiedler with MTX   Past Surgical History:  Procedure Laterality Date   BREAST BIOPSY Left 2012   fibroadenoma   BREAST BIOPSY Left 10/12/2019   Affirm Bx- Ribbon clip- fibroadenoma   BREAST BIOPSY Right 10/12/2019   Affirm Bx- X-clip- fibroadenoma   CARDIAC CATHETERIZATION     COMBINED HYSTEROSCOPY DIAGNOSTIC / D&C  July 2008   [facility]   ESOPHAGOGASTRODUODENOSCOPY  July 2008   normal   EXCISION MORTON'S NEUROMA     Left foot   JOINT REPLACEMENT Left    KNEE ARTHROSCOPY Left  01/07/2016   Procedure: ARTHROSCOPY KNEE, lateral release, partial lateral menisectomy, excision plica;  Surgeon: Kennedy Bucker, MD;  Location: [facility];  Service: Orthopedics;  Laterality: Left;   LEFT HEART CATH AND CORONARY ANGIOGRAPHY Left 02/03/2018   Procedure: LEFT HEART CATH AND CORONARY ANGIOGRAPHY;  Surgeon: Marcina Millard, MD;  Location: [facility];  Service: Cardiovascular;  Laterality: Left;   METATARSAL OSTEOTOMY  dec 2012   right foot, 5th MT  ([place])   OPEN REDUCTION INTERNAL FIXATION (ORIF) DISTAL RADIAL FRACTURE Right 11/05/2020   Procedure: OPEN REDUCTION INTERNAL FIXATION (ORIF) DISTAL RADIAL FRACTURE;  Surgeon: Kennedy Bucker, MD;  Location: [facility];  Service: Orthopedics;   Laterality: Right;   TOTAL KNEE ARTHROPLASTY Left 06/28/2018   Procedure: TOTAL KNEE ARTHROPLASTY;  Surgeon: Kennedy Bucker, MD;  Location: [facility];  Service: Orthopedics;  Laterality: Left;   Family History  Problem Relation Age of Onset   Diabetes Father    Heart disease Father    Cancer Maternal Aunt        Breast, metastatic   Breast cancer Maternal Aunt 60   Cancer Maternal Grandmother        ovarian   Dementia Mother    Thyroid disease Neg Hx    Social History   Socioeconomic History   Marital status: Married    Spouse name: Not on file   Number of children: Not on file   Years of education: Not on file   Highest education level: Not on file  Occupational History   Not on file  Tobacco Use   Smoking status: Never   Smokeless tobacco: Never  Vaping Use   Vaping Use: Never used  Substance and Sexual Activity   Alcohol use: Yes    Comment: occasional   Drug use: No   Sexual activity: Not Currently  Other Topics Concern   Not on file  Social History Narrative   Not on file   Social Determinants of Health   Financial Resource Strain: Low Risk  (09/29/2022)   Overall Financial Resource Strain (CARDIA)    Difficulty of Paying Living Expenses: Not hard at all  Food Insecurity: No Food Insecurity (09/29/2022)   Hunger Vital Sign    Worried About Running Out of Food in the Last Year: Never true    Ran Out of Food in the Last Year: Never true  Transportation Needs: No Transportation Needs (09/29/2022)   PRAPARE - Administrator, Civil Service (Medical): No    Lack of Transportation (Non-Medical): No  Physical Activity: Sufficiently Active (08/08/2021)   Exercise Vital Sign    Days of Exercise per Week: 5 days    Minutes of Exercise per Session: 50 min  Stress: No Stress Concern Present (09/29/2022)   [employer] of Occupational Health - Occupational Stress Questionnaire    Feeling of Stress : Not at all  Social Connections: Unknown (09/29/2022)    Social Connection and Isolation Panel [NHANES]    Frequency of Communication with Friends and Family: Not on file    Frequency of Social Gatherings with Friends and Family: Not on file    Attends Religious Services: Not on file    Active Member of Clubs or Organizations: Not on file    Attends Banker Meetings: Not on file    Marital Status: Married    Tobacco Counseling Counseling given: Not Answered   Clinical Intake:  Pre-visit preparation completed: Yes        Diabetes: No  How often do you need to have someone help you when you read instructions, pamphlets, or other written materials from your doctor or pharmacy?: 1 - Never   Interpreter Needed?: No      Activities of Daily Living    09/29/2022    2:20 PM  In your present state of health, do you have any difficulty performing the following activities:  Hearing? 0  Vision? 0  Difficulty concentrating or making decisions? 0  Walking or climbing stairs? 0  Dressing or bathing? 0  Doing errands, shopping? 0  Preparing Food and eating ? N  Using the Toilet? N  In the past six months, have you accidently leaked urine? N  Do you have problems with loss of bowel control? N  Managing your Medications? N  Managing your Finances? N  Housekeeping or managing your Housekeeping? N    Patient Care Team: Sherlene Shams, MD as PCP - General (Internal Medicine) Sherlene Shams, MD (Internal Medicine)  Indicate any recent Medical Services you may have received from other than [facility] providers in the past year (date may be approximate).     Assessment:   This is a routine wellness examination for Hevin.  I connected with  Ann Maki on 09/29/22 by a audio enabled telemedicine application and verified that I am speaking with the correct person using two identifiers.  Patient Location: Home  Provider Location: Office/Clinic  I discussed the limitations of evaluation and management by telemedicine.  The patient expressed understanding and agreed to proceed.   Hearing/Vision screen Hearing Screening - Comments:: Patient is able to hear conversational tones without difficulty. No issues reported   Vision Screening - Comments:: Followed by Dr. Clydene Pugh Wears corrective lenses  They have seen their ophthalmologist in the last 12 months  Dietary issues and exercise activities discussed: Current Exercise Habits: Home exercise routine, Type of exercise: walking, Intensity: Mild   Goals Addressed   None    Depression Screen    09/29/2022    2:11 PM 09/22/2021   10:27 AM 08/08/2021    9:09 AM 08/07/2020    9:13 AM 08/07/2019    9:08 AM 02/11/2018   10:13 AM 03/19/2015    3:08 PM  PHQ 2/9 Scores  PHQ - 2 Score 0 0 0 0 0 0 0  PHQ- 9 Score      3     Fall Risk    09/29/2022    2:20 PM 11/17/2021   10:08 AM 09/22/2021   10:27 AM 08/08/2021    9:11 AM 10/14/2020    1:31 PM  Fall Risk   Falls in the past year? 0 1 1 0 0  Number falls in past yr: 0 0 0    Injury with Fall? 0 1 1    Risk for fall due to :  History of fall(s)     Follow up Falls evaluation completed Falls evaluation completed Falls evaluation completed Falls evaluation completed Falls evaluation completed    FALL RISK PREVENTION PERTAINING TO THE HOME: Home free of loose throw rugs in walkways, pet beds, electrical cords, etc? Yes  Adequate lighting in your home to reduce risk of falls? Yes   ASSISTIVE DEVICES UTILIZED TO PREVENT FALLS: Life alert? No  Use of a cane, walker or w/c? No   TIMED UP AND GO: Was the test performed? No .   Cognitive Function:        09/29/2022    2:31 PM 08/07/2019  9:12 AM  6CIT Screen  What Year? 0 points 0 points  What month? 0 points 0 points  What time? 0 points 0 points  Count back from 20 0 points 0 points  Months in reverse 0 points 0 points  Repeat phrase 0 points   Total Score 0 points     Immunizations Immunization History  Administered Date(s) Administered    Fluad Quad(high Dose 65+) 09/26/2019   Influenza, High Dose Seasonal PF 10/14/2018   Influenza-Unspecified 09/14/2013, 09/18/2014, 10/16/2015, 10/21/2017, 11/03/2021   Moderna Covid-19 Vaccine Bivalent Booster 73yrs & up 10/13/2021   Moderna Sars-Covid-2 Vaccination 01/23/2020, 02/20/2020   Pneumococcal Conjugate-13 03/19/2015   Pneumococcal Polysaccharide-23 10/23/2010, 02/11/2018   Tdap 10/20/2012   Flu Vaccine status: Due, Education has been provided regarding the importance of this vaccine. Advised may receive this vaccine at local pharmacy or Health Dept. Aware to provide a copy of the vaccination record if obtained from local pharmacy or Health Dept. Verbalized acceptance and understanding.  Covid-19 vaccine status: Completed vaccines X3.    Shingrix Completed?: No.    Education has been provided regarding the importance of this vaccine. Patient has been advised to call insurance company to determine out of pocket expense if they have not yet received this vaccine. Advised may also receive vaccine at local pharmacy or Health Dept. Verbalized acceptance and understanding.  Screening Tests Health Maintenance  Topic Date Due   COVID-19 Vaccine (4 - Moderna risk series) 10/15/2022 (Originally 12/08/2021)   Fecal DNA (Cologuard)  11/10/2022 (Originally 08/29/1996)   Zoster Vaccines- Shingrix (1 of 2) 12/30/2022 (Originally 08/29/1970)   INFLUENZA VACCINE  03/28/2023 (Originally 07/28/2022)   TETANUS/TDAP  10/20/2022   MAMMOGRAM  10/03/2023   Pneumonia Vaccine 50+ Years old  Completed   DEXA SCAN  Completed   Hepatitis C Screening  Completed   HPV VACCINES  Aged Out   COLONOSCOPY (Pts 45-50yrs Insurance coverage will need to be confirmed)  Discontinued    Health Maintenance  There are no preventive care reminders to display for this patient.  Cologuard- deferred per patient preference.   Lung Cancer Screening: (Low Dose CT Chest recommended if Age 58-80 years, 30 pack-year  currently smoking OR have quit w/in 15years.) does not qualify.   Hepatitis C Screening: Completed 2016.  Vision Screening: Recommended annual ophthalmology exams for early detection of glaucoma and other disorders of the eye.  Dental Screening: Recommended annual dental exams for proper oral hygiene  Community Resource Referral / Chronic Care Management: CRR required this visit?  No   CCM required this visit?  No      Plan:     I have personally reviewed and noted the following in the patient's chart:   Medical and social history Use of alcohol, tobacco or illicit drugs  Current medications and supplements including opioid prescriptions. Patient is not currently taking opioid prescriptions. Functional ability and status Nutritional status Physical activity Advanced directives List of other physicians Hospitalizations, surgeries, and ER visits in previous 12 months Vitals Screenings to include cognitive, depression, and falls Referrals and appointments  In addition, I have reviewed and discussed with patient certain preventive protocols, quality metrics, and best practice recommendations. A written personalized care plan for preventive services as well as general preventive health recommendations were provided to patient.     OBrien-Blaney, Monterius Rolf L, LPN   60/05/3015      I have reviewed the above information and agree with above.   Deborra Medina, MD

## 2022-09-29 NOTE — Patient Instructions (Addendum)
[name] , Thank you for taking time to come for your Medicare Wellness Visit. I appreciate your ongoing commitment to your health goals. Please review the following plan we discussed and let me know if I can assist you in the future.   These are the goals we discussed:  Goals       Patient Stated     Increase physical activity (pt-stated)      I want to increase my daily steps Swim more for exercise       Weight (lb) < 169 lb (76.7 kg) (pt-stated)      Lose 10-15lb        This is a list of the screening recommended for you and due dates:  Health Maintenance  Topic Date Due   COVID-19 Vaccine (4 - Moderna risk series) 10/15/2022*   Cologuard (Stool DNA test)  11/10/2022*   Zoster (Shingles) Vaccine (1 of 2) 12/30/2022*   Flu Shot  03/28/2023*   Tetanus Vaccine  10/20/2022   Mammogram  10/03/2023   Pneumonia Vaccine  Completed   DEXA scan (bone density measurement)  Completed   Hepatitis C Screening: USPSTF Recommendation to screen - Ages 60-79 yo.  Completed   HPV Vaccine  Aged Out   Colon Cancer Screening  Discontinued  *Topic was postponed. The date shown is not the original due date.    Advanced directives: End of life planning; Advance aging; Advanced directives discussed.  Copy of current HCPOA/Living Will requested.    Conditions/risks identified: none new  Next appointment: Follow up in one year for your annual wellness visit.   Preventive Care 49 Years and Older, Female Preventive care refers to lifestyle choices and visits with your health care provider that can promote health and wellness. What does preventive care include? A yearly physical exam. This is also called an annual well check. Dental exams once or twice a year. Routine eye exams. Ask your health care provider how often you should have your eyes checked. Personal lifestyle choices, including: Daily care of your teeth and gums. Regular physical activity. Eating a healthy diet. Avoiding  tobacco and drug use. Limiting alcohol use. Practicing safe sex. Taking low-dose aspirin every day. Taking vitamin and mineral supplements as recommended by your health care provider. What happens during an annual well check? The services and screenings done by your health care provider during your annual well check will depend on your age, overall health, lifestyle risk factors, and family history of disease. Counseling  Your health care provider may ask you questions about your: Alcohol use. Tobacco use. Drug use. Emotional well-being. Home and relationship well-being. Sexual activity. Eating habits. History of falls. Memory and ability to understand (cognition). Work and work Statistician. Reproductive health. Screening  You may have the following tests or measurements: Height, weight, and BMI. Blood pressure. Lipid and cholesterol levels. These may be checked every 5 years, or more frequently if you are over 63 years old. Skin check. Lung cancer screening. You may have this screening every year starting at age 85 if you have a 30-pack-year history of smoking and currently smoke or have quit within the past 15 years. Fecal occult blood test (FOBT) of the stool. You may have this test every year starting at age 40. Flexible sigmoidoscopy or colonoscopy. You may have a sigmoidoscopy every 5 years or a colonoscopy every 10 years starting at age 51. Hepatitis C blood test. Hepatitis B blood test. Sexually transmitted disease (STD) testing. Diabetes screening. This is  done by checking your blood sugar (glucose) after you have not eaten for a while (fasting). You may have this done every 1-3 years. Bone density scan. This is done to screen for osteoporosis. You may have this done starting at age 55. Mammogram. This may be done every 1-2 years. Talk to your health care provider about how often you should have regular mammograms. Talk with your health care provider about your test  results, treatment options, and if necessary, the need for more tests. Vaccines  Your health care provider may recommend certain vaccines, such as: Influenza vaccine. This is recommended every year. Tetanus, diphtheria, and acellular pertussis (Tdap, Td) vaccine. You may need a Td booster every 10 years. Zoster vaccine. You may need this after age 82. Pneumococcal 13-valent conjugate (PCV13) vaccine. One dose is recommended after age 64. Pneumococcal polysaccharide (PPSV23) vaccine. One dose is recommended after age 49. Talk to your health care provider about which screenings and vaccines you need and how often you need them. This information is not intended to replace advice given to you by your health care provider. Make sure you discuss any questions you have with your health care provider. Document Released: 01/10/2016 Document Revised: 09/02/2016 Document Reviewed: 10/15/2015 Elsevier Interactive Patient Education  2017 [employer] Prevention in the Home Falls can cause injuries. They can happen to people of all ages. There are many things you can do to make your home safe and to help prevent falls. What can I do on the outside of my home? Regularly fix the edges of walkways and driveways and fix any cracks. Remove anything that might make you trip as you walk through a door, such as a raised step or threshold. Trim any bushes or trees on the path to your home. Use bright outdoor lighting. Clear any walking paths of anything that might make someone trip, such as rocks or tools. Regularly check to see if handrails are loose or broken. Make sure that both sides of any steps have handrails. Any raised decks and porches should have guardrails on the edges. Have any leaves, snow, or ice cleared regularly. Use sand or salt on walking paths during winter. Clean up any spills in your garage right away. This includes oil or grease spills. What can I do in the bathroom? Use night  lights. Install grab bars by the toilet and in the tub and shower. Do not use towel bars as grab bars. Use non-skid mats or decals in the tub or shower. If you need to sit down in the shower, use a plastic, non-slip stool. Keep the floor dry. Clean up any water that spills on the floor as soon as it happens. Remove soap buildup in the tub or shower regularly. Attach bath mats securely with double-sided non-slip rug tape. Do not have throw rugs and other things on the floor that can make you trip. What can I do in the bedroom? Use night lights. Make sure that you have a light by your bed that is easy to reach. Do not use any sheets or blankets that are too big for your bed. They should not hang down onto the floor. Have a firm chair that has side arms. You can use this for support while you get dressed. Do not have throw rugs and other things on the floor that can make you trip. What can I do in the kitchen? Clean up any spills right away. Avoid walking on wet floors. Keep items that you  use a lot in easy-to-reach places. If you need to reach something above you, use a strong step stool that has a grab bar. Keep electrical cords out of the way. Do not use floor polish or wax that makes floors slippery. If you must use wax, use non-skid floor wax. Do not have throw rugs and other things on the floor that can make you trip. What can I do with my stairs? Do not leave any items on the stairs. Make sure that there are handrails on both sides of the stairs and use them. Fix handrails that are broken or loose. Make sure that handrails are as long as the stairways. Check any carpeting to make sure that it is firmly attached to the stairs. Fix any carpet that is loose or worn. Avoid having throw rugs at the top or bottom of the stairs. If you do have throw rugs, attach them to the floor with carpet tape. Make sure that you have a light switch at the top of the stairs and the bottom of the stairs. If  you do not have them, ask someone to add them for you. What else can I do to help prevent falls? Wear shoes that: Do not have high heels. Have rubber bottoms. Are comfortable and fit you well. Are closed at the toe. Do not wear sandals. If you use a stepladder: Make sure that it is fully opened. Do not climb a closed stepladder. Make sure that both sides of the stepladder are locked into place. Ask someone to hold it for you, if possible. Clearly mark and make sure that you can see: Any grab bars or handrails. First and last steps. Where the edge of each step is. Use tools that help you move around (mobility aids) if they are needed. These include: Canes. Walkers. Scooters. Crutches. Turn on the lights when you go into a dark area. Replace any light bulbs as soon as they burn out. Set up your furniture so you have a clear path. Avoid moving your furniture around. If any of your floors are uneven, fix them. If there are any pets around you, be aware of where they are. Review your medicines with your doctor. Some medicines can make you feel dizzy. This can increase your chance of falling. Ask your doctor what other things that you can do to help prevent falls. This information is not intended to replace advice given to you by your health care provider. Make sure you discuss any questions you have with your health care provider. Document Released: 10/10/2009 Document Revised: 05/21/2016 Document Reviewed: 01/18/2015 Elsevier Interactive Patient Education  2017 [employer].

## 2022-11-06 ENCOUNTER — Other Ambulatory Visit (INDEPENDENT_AMBULATORY_CARE_PROVIDER_SITE_OTHER): Payer: Medicare Other

## 2022-11-06 DIAGNOSIS — R7301 Impaired fasting glucose: Secondary | ICD-10-CM | POA: Diagnosis not present

## 2022-11-06 DIAGNOSIS — I10 Essential (primary) hypertension: Secondary | ICD-10-CM

## 2022-11-06 DIAGNOSIS — R5383 Other fatigue: Secondary | ICD-10-CM

## 2022-11-06 DIAGNOSIS — E0590 Thyrotoxicosis, unspecified without thyrotoxic crisis or storm: Secondary | ICD-10-CM

## 2022-11-06 DIAGNOSIS — E785 Hyperlipidemia, unspecified: Secondary | ICD-10-CM | POA: Diagnosis not present

## 2022-11-06 LAB — COMPREHENSIVE METABOLIC PANEL
ALT: 8 U/L (ref 0–35)
AST: 14 U/L (ref 0–37)
Albumin: 4.1 g/dL (ref 3.5–5.2)
Alkaline Phosphatase: 50 U/L (ref 39–117)
BUN: 7 mg/dL (ref 6–23)
CO2: 28 mEq/L (ref 19–32)
Calcium: 8.6 mg/dL (ref 8.4–10.5)
Chloride: 104 mEq/L (ref 96–112)
Creatinine, Ser: 0.66 mg/dL (ref 0.40–1.20)
GFR: 88.4 mL/min (ref >60.00)
Glucose, Bld: 83 mg/dL (ref 70–99)
Potassium: 4.2 mEq/L (ref 3.5–5.1)
Sodium: 138 mEq/L (ref 135–145)
Total Bilirubin: 0.4 mg/dL (ref 0.2–1.2)
Total Protein: 6.8 g/dL (ref 6.0–8.3)

## 2022-11-06 LAB — MICROALBUMIN / CREATININE URINE RATIO
Creatinine,U: 24.3 mg/dL
Microalb Creat Ratio: 2.9 mg/g (ref 0.0–30.0)
Microalb, Ur: <0.7 mg/dL (ref 0.0–1.9)

## 2022-11-06 LAB — CBC WITH DIFFERENTIAL/PLATELET
Basophils Absolute: 0 10*3/uL (ref 0.0–0.1)
Basophils Relative: 0.8 % (ref 0.0–3.0)
Eosinophils Absolute: 0.4 10*3/uL (ref 0.0–0.7)
Eosinophils Relative: 8.6 % — ABNORMAL HIGH (ref 0.0–5.0)
HCT: 39.3 % (ref 36.0–46.0)
Hemoglobin: 12.8 g/dL (ref 12.0–15.0)
Lymphocytes Relative: 34.3 % (ref 12.0–46.0)
Lymphs Abs: 1.7 10*3/uL (ref 0.7–4.0)
MCHC: 32.5 g/dL (ref 30.0–36.0)
MCV: 93.1 fl (ref 78.0–100.0)
Monocytes Absolute: 0.5 10*3/uL (ref 0.1–1.0)
Monocytes Relative: 10.7 % (ref 3.0–12.0)
Neutro Abs: 2.2 10*3/uL (ref 1.4–7.7)
Neutrophils Relative %: 45.6 % (ref 43.0–77.0)
Platelets: 156 10*3/uL (ref 150.0–400.0)
RBC: 4.21 Mil/uL (ref 3.87–5.11)
RDW: 13.8 % (ref 11.5–15.5)
WBC: 4.9 10*3/uL (ref 4.0–10.5)

## 2022-11-06 LAB — LIPID PANEL
Cholesterol: 185 mg/dL (ref 0–200)
HDL: 76.7 mg/dL (ref >39.00)
LDL Cholesterol: 87 mg/dL (ref 0–99)
NonHDL: 108.2
Total CHOL/HDL Ratio: 2
Triglycerides: 105 mg/dL (ref 0.0–149.0)
VLDL: 21 mg/dL (ref 0.0–40.0)

## 2022-11-06 LAB — HEMOGLOBIN A1C: Hgb A1c MFr Bld: 5.6 % (ref 4.6–6.5)

## 2022-11-06 LAB — LDL CHOLESTEROL, DIRECT: Direct LDL: 86 mg/dL

## 2022-11-06 LAB — TSH: TSH: 1.94 u[IU]/mL (ref 0.35–5.50)

## 2022-11-11 ENCOUNTER — Encounter: Payer: Self-pay | Admitting: Internal Medicine

## 2022-11-11 ENCOUNTER — Ambulatory Visit (INDEPENDENT_AMBULATORY_CARE_PROVIDER_SITE_OTHER): Payer: Medicare Other | Admitting: Internal Medicine

## 2022-11-11 VITALS — BP 122/74 | HR 77 | Temp 97.8°F | Ht 60.0 in | Wt 172.2 lb

## 2022-11-11 DIAGNOSIS — M069 Rheumatoid arthritis, unspecified: Secondary | ICD-10-CM

## 2022-11-11 DIAGNOSIS — Z1211 Encounter for screening for malignant neoplasm of colon: Secondary | ICD-10-CM | POA: Diagnosis not present

## 2022-11-11 DIAGNOSIS — Z1231 Encounter for screening mammogram for malignant neoplasm of breast: Secondary | ICD-10-CM

## 2022-11-11 DIAGNOSIS — M810 Age-related osteoporosis without current pathological fracture: Secondary | ICD-10-CM

## 2022-11-11 DIAGNOSIS — I422 Other hypertrophic cardiomyopathy: Secondary | ICD-10-CM | POA: Diagnosis not present

## 2022-11-11 DIAGNOSIS — E0590 Thyrotoxicosis, unspecified without thyrotoxic crisis or storm: Secondary | ICD-10-CM

## 2022-11-11 MED ORDER — PREDNISOLONE ACETATE 1 % OP SUSP: OPHTHALMIC | 1 refills | Status: AC

## 2022-11-11 MED ORDER — ALPRAZOLAM 0.5 MG PO TABS: 0.5000 mg | ORAL_TABLET | Freq: Every evening | ORAL | 0 refills | Status: AC | PRN

## 2022-11-11 MED ORDER — RALOXIFENE HCL 60 MG PO TABS: 60.0000 mg | ORAL_TABLET | Freq: Every day | ORAL | 1 refills | Status: AC

## 2022-11-11 NOTE — Progress Notes (Unsigned)
Patient ID: Brooke Tucker, female    DOB: July 21, 1951  Age: 71 y.o. MRN: [id number]  The patient is here for follow up and management of other chronic and acute problems.   The risk factors are reflected in the social history.  The roster of all physicians providing medical care to patient - is listed in the Snapshot section of the chart.  Activities of daily living:  The patient is 100% independent in all ADLs: dressing, toileting, feeding as well as independent mobility  Home safety : The patient has smoke detectors in the home. They wear seatbelts.  There are no firearms at home. There is no violence in the home.   There is no risks for hepatitis, STDs or HIV. There is no   history of blood transfusion. They have no travel history to infectious disease endemic areas of the world.  The patient has seen their dentist in the last six month. They have seen their eye doctor in the last year. They admit to slight hearing difficulty with regard to whispered voices and some television programs.  They have deferred audiologic testing in the last year.  They do not  have excessive sun exposure. Discussed the need for sun protection: hats, long sleeves and use of sunscreen if there is significant sun exposure.   Diet: the importance of a healthy diet is discussed. They do have a healthy diet.  The benefits of regular aerobic exercise were discussed. She walks 4 times per week ,  20 minutes.   Depression screen: there are no signs or vegative symptoms of depression- irritability, change in appetite, anhedonia, sadness/tearfullness.  Cognitive assessment: the patient manages all their financial and personal affairs and is actively engaged. They could relate day,date,year and events; recalled 2/3 objects at 3 minutes; performed clock-face test normally.  The following portions of the patient's history were reviewed and updated as appropriate: allergies, current medications, past family history, past  medical history,  past surgical history, past social history  and problem list.  Visual acuity was not assessed per patient preference since she has regular follow up with her ophthalmologist. Hearing and body mass index were assessed and reviewed.   During the course of the visit the patient was educated and counseled about appropriate screening and preventive services including : fall prevention , diabetes screening, nutrition counseling, colorectal cancer screening, and recommended immunizations.    CC: The primary encounter diagnosis was Colon cancer screening. 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  1) RA : symptoms are under much better  control with change in therapy from MTX to  Cimzia ;  she did not tolerate methotrexate  2) [place]  [name] has a past medical history of Arthritis, Asthma, Cardiomyopathy ([place]), Closed right radial fracture (09/23/2021), Dysrhythmia, Hypertension, Hyperthyroidism, Mild aortic valve stenosis, Multinodular goiter, Ovarian cyst (06/2007), and Rheumatoid arthritis(714.0).   She has a past surgical history that includes Esophagogastroduodenoscopy (July 2008); Combined hysteroscopy diagnostic / D&C (July 2008); Excision Morton's neuroma; Metatarsal osteotomy (dec 2012); Knee arthroscopy (Left, 01/07/2016); LEFT HEART CATH AND CORONARY ANGIOGRAPHY (Left, 02/03/2018); Total knee arthroplasty (Left, 06/28/2018); Breast biopsy (Left, 2012); Breast biopsy (Left, 10/12/2019); Breast biopsy (Right, 10/12/2019); Cardiac catheterization; Joint replacement (Left); and Open reduction  internal  fixation (orif) distal radial fracture (Right, 11/05/2020).   Her family history includes Breast cancer (age of onset: 29) in her maternal aunt; Cancer in her maternal aunt and maternal grandmother; Dementia in her mother; Diabetes in her father; Heart disease in her father.She reports that she has never smoked. She has never used smokeless tobacco. She reports current alcohol use. She reports that she does not use drugs.  Outpatient Medications Prior to Visit  Medication Sig Dispense Refill   certolizumab pegol (CIMZIA) 2 X 200 MG/ML PSKT Inject into the skin.     diphenhydramine-acetaminophen (TYLENOL PM) 25-500 MG TABS tablet Take 1 tablet by mouth at bedtime.     lisinopril (ZESTRIL) 10 MG tablet Take 1 tablet by mouth daily.     raloxifene (EVISTA) 60 MG tablet Take 1 tablet (60 mg total) by mouth daily. 30 tablet 11   methotrexate (RHEUMATREX) 2.5 MG tablet Take 2.5 mg by mouth once a week. Takes 6 tablets weekly     metoprolol succinate (TOPROL-XL) 100 MG 24 hr tablet Take 100 mg by mouth daily. Take with or immediately following a meal.     ondansetron (ZOFRAN ODT) 4 MG disintegrating tablet Take 1 tablet (4 mg total) by mouth every 8 (eight) hours as needed for nausea or vomiting. 20 tablet 5   No facility-administered medications prior to visit.    Review of Systems  Patient denies headache, fevers, malaise, unintentional weight loss, skin rash, eye pain, sinus congestion and sinus pain, sore throat, dysphagia,  hemoptysis , cough, dyspnea, wheezing, chest pain, palpitations, orthopnea, edema, abdominal pain, nausea, melena, diarrhea, constipation, flank pain, dysuria, hematuria, urinary  Frequency, nocturia, numbness, tingling, seizures,  Focal weakness, Loss of consciousness,  Tremor, insomnia, depression, anxiety, and suicidal ideation.     Objective:  BP 122/74 (BP Location: Left Arm, Patient Position: Sitting, Cuff Size: Large)   Pulse 77   Temp 97.8 F (36.6 C) (Oral)   Ht  5' (1.524 m)   Wt 172 lb 3.2 oz (78.1 kg)   SpO2 98%   BMI 33.63 kg/m   Physical Exam   General appearance: alert, cooperative and appears stated age Head: Normocephalic, without obvious abnormality, atraumatic Eyes: conjunctivae/corneas clear. PERRL, EOM's intact. Fundi benign. Ears: normal TM's and external ear canals both ears Nose: Nares normal. Septum midline. Mucosa normal. No drainage or sinus tenderness. Throat: lips, mucosa, and tongue normal; teeth and gums normal Neck: no adenopathy, no carotid bruit, no JVD, supple, symmetrical, trachea midline and thyroid not enlarged, symmetric, no tenderness/mass/nodules Lungs: clear to auscultation bilaterally Breasts: normal appearance, no masses or tenderness Heart: regular rate and rhythm, S1, S2 normal, no murmur, click, rub or gallop Abdomen: soft, non-tender; bowel sounds normal; no masses,  no organomegaly Extremities: extremities normal, atraumatic, no cyanosis or edema Pulses: 2+ and symmetric Skin: Skin color, texture, turgor normal. No rashes or lesions Neurologic: Alert and oriented X 3, normal strength and tone. Normal symmetric reflexes. Normal coordination and gait.    Assessment & Plan:   Problem List Items Addressed This Visit     Hyperthyroidism    Thyroid function was rechecked checked;  STABLE .  She has a history of nontoxic MNG .   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   INTERPRETATION  NORMAL LEFT VENTRICULAR  SYSTOLIC FUNCTION   WITH MILD LVH  NORMAL RIGHT VENTRICULAR SYSTOLIC FUNCTION  MILD VALVULAR REGURGITATION (See above)  MILD VALVULAR STENOSIS (See above)  ESTIMATED LVEF 55% (CALCULATED: 54.3%)  GLS: -18.9%       Osteoporosis    She is tolerating Evista which was started in 2022 for ; significant decrease from 2015 to 2022 and  history of fracture.  .  Reviewed calcium and Vitamin D needs       Relevant Medications   raloxifene (EVISTA) 60 MG tablet   Rheumatoid arthritis ([place])    Managed by Dr. Posey Pronto at [facility] with Climzia injections .  ESR and CRP reviewed and normal      Other Visit Diagnoses     Colon cancer screening    -  Primary   Relevant Orders   Cologuard   Encounter for screening mammogram for malignant neoplasm of breast       Relevant Orders   MM 3D SCREEN BREAST BILATERAL       I have discontinued Beckie Busing. Hogans's metoprolol succinate, ondansetron, and methotrexate. I am also having her start on prednisoLONE acetate. Additionally, I am having her maintain her diphenhydramine-acetaminophen, lisinopril, Cimzia, raloxifene, and ALPRAZolam.    I provided 40 minutes of  face-to-face time during this encounter reviewing patient's current problems and recent rheumatology and endocrinology evaluations.    recent labs and imaging studies, providing counseling on the above mentioned problems , and coordination  of care .   Follow-up: Return in about 6 months (around 05/12/2023).   Crecencio Mc, MD

## 2022-11-11 NOTE — Patient Instructions (Addendum)
Talk to your  cardiologist about changing lisinopril  to losartan because of your cough    The Tdap (tetanus-diphtheria-whooping cough vaccine ) and Shingrix vaccines are now  COVERED BY MEDICARE if you get them at your pharmacy as of  January 1.   You are due for both  You can expect 24 hours of flu like symptoms after receiving the shingles vaccine, so plan accordingly.     Alprazolam has been refilled along with the eye drops   Your annual mammogram  has  been ordered.  Please call [place] to call to make your appointments  .  The phone number for [facility] is  [phone]

## 2022-11-12 ENCOUNTER — Encounter: Payer: Self-pay | Admitting: Internal Medicine

## 2022-11-12 NOTE — Assessment & Plan Note (Signed)
Thyroid function was rechecked checked;  STABLE .  She has a history of nontoxic MNG .   Lab Results  Component Value Date   TSH 1.94 11/06/2022

## 2022-11-12 NOTE — Assessment & Plan Note (Signed)
Managed by Dr. Posey Pronto at [facility] with Climzia injections .  ESR and CRP reviewed and normal

## 2022-11-12 NOTE — Assessment & Plan Note (Addendum)
SHE HAS HAD  repeat ECHO this year by [facility] with no significant change.   INTERPRETATION  NORMAL LEFT VENTRICULAR SYSTOLIC FUNCTION   WITH MILD LVH  NORMAL RIGHT VENTRICULAR SYSTOLIC FUNCTION  MILD VALVULAR REGURGITATION (See above)  MILD VALVULAR STENOSIS (See above)  ESTIMATED LVEF 55% (CALCULATED: 54.3%)  GLS: -18.9%

## 2022-11-12 NOTE — Assessment & Plan Note (Signed)
She received regular follow up on liver and bone marrow function on methotrexate.. now taking Clinzia.  Repeat labs ordered and reviewed,  all normal   Lab Results  Component Value Date   WBC 4.9 11/06/2022   HGB 12.8 11/06/2022   HCT 39.3 11/06/2022   MCV 93.1 11/06/2022   PLT 156.0 11/06/2022   Lab Results  Component Value Date   CREATININE 0.66 11/06/2022   Lab Results  Component Value Date   ALT 8 11/06/2022   AST 14 11/06/2022   ALKPHOS 50 11/06/2022   BILITOT 0.4 11/06/2022

## 2022-11-12 NOTE — Assessment & Plan Note (Signed)
She is tolerating Evista which was started in 2022 for ; significant decrease from 2015 to 2022 and history of fracture.  .  Reviewed calcium and Vitamin D needs

## 2022-12-08 NOTE — Telephone Encounter (Signed)
MyChart messgae sent to patient.
